# Patient Record
Sex: Female | Born: 1980 | Race: Black or African American | Hispanic: No | Marital: Married | Smoking: Former smoker
Health system: Southern US, Community
[De-identification: ages and names within clinical notes are randomized; demographics above are authoritative.]

## PROBLEM LIST (undated history)

## (undated) DIAGNOSIS — Z789 Other specified health status: Secondary | ICD-10-CM

---
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
## 2002-08-25 ENCOUNTER — Encounter: Payer: Self-pay | Admitting: Emergency Medicine

## 2002-08-25 ENCOUNTER — Emergency Department (HOSPITAL_COMMUNITY): Admission: EM | Admit: 2002-08-25 | Discharge: 2002-08-25 | Payer: Self-pay | Admitting: Emergency Medicine

## 2005-10-18 ENCOUNTER — Emergency Department (HOSPITAL_COMMUNITY): Admission: EM | Admit: 2005-10-18 | Discharge: 2005-10-18 | Payer: Self-pay | Admitting: Emergency Medicine

## 2006-01-27 ENCOUNTER — Emergency Department (HOSPITAL_COMMUNITY): Admission: EM | Admit: 2006-01-27 | Discharge: 2006-01-27 | Payer: Self-pay | Admitting: Emergency Medicine

## 2006-04-08 ENCOUNTER — Emergency Department (HOSPITAL_COMMUNITY): Admission: EM | Admit: 2006-04-08 | Discharge: 2006-04-08 | Payer: Self-pay | Admitting: Emergency Medicine

## 2007-05-08 ENCOUNTER — Ambulatory Visit (HOSPITAL_COMMUNITY): Admission: RE | Admit: 2007-05-08 | Discharge: 2007-05-08 | Payer: Self-pay | Admitting: Obstetrics & Gynecology

## 2009-03-30 ENCOUNTER — Other Ambulatory Visit: Admission: RE | Admit: 2009-03-30 | Discharge: 2009-03-30 | Payer: Self-pay | Admitting: Family Medicine

## 2010-12-11 NOTE — L&D Delivery Note (Signed)
  Annalisa, [facility] [[id number]]  Delivery Note At 6:37 PM a viable female was delivered via Vaginal, Spontaneous Delivery (Presentation: occiput anterior ).  APGAR: 8, 8; weight 1784g.   Placenta status: Intact, Spontaneous.  Cord: 3 vessel with the following complications: none.    Anesthesia:  Epidural Episiotomy: none Lacerations: none Est. Blood Loss (mL): 400  Mom to postpartum.  Baby to NICU.  HARPER,CHARLES A 11/30/2011, 6:59 PM     Jeanna, Giuffre [[id number]]  Delivery Note At 6:44 PM a viable female was delivered via  (Presentation: Right Occiput Anterior with compound hand).  APGAR: 8, 8; weight  1625g.   Placenta status: Intact, Spontaneous.  Cord: 3 vessel with the following complications: none.    Anesthesia: epidural Episiotomy: none Lacerations: none Est. Blood Loss (mL): see above ( ).  Mom to postpartum.  Baby to NICU.  HARPER,CHARLES A 11/30/2011, 6:59 PM

## 2011-06-18 ENCOUNTER — Emergency Department (HOSPITAL_COMMUNITY)
Admission: EM | Admit: 2011-06-18 | Discharge: 2011-06-18 | Disposition: A | Discharge status: Home / Self Care | Payer: Commercial Managed Care - PPO | Attending: Emergency Medicine | Admitting: Emergency Medicine

## 2011-06-18 DIAGNOSIS — O209 Hemorrhage in early pregnancy, unspecified: Secondary | ICD-10-CM | POA: Insufficient documentation

## 2011-06-18 DIAGNOSIS — Z7982 Long term (current) use of aspirin: Secondary | ICD-10-CM | POA: Insufficient documentation

## 2011-06-18 DIAGNOSIS — O30009 Twin pregnancy, unspecified number of placenta and unspecified number of amniotic sacs, unspecified trimester: Secondary | ICD-10-CM | POA: Insufficient documentation

## 2011-06-18 DIAGNOSIS — Z79899 Other long term (current) drug therapy: Secondary | ICD-10-CM | POA: Insufficient documentation

## 2011-06-18 LAB — POCT I-STAT, CHEM 8
BUN: 5 mg/dL — ABNORMAL LOW (ref 6–23)
Calcium, Ion: 1.19 mmol/L (ref 1.12–1.32)
Chloride: 105 meq/L (ref 96–112)
Creatinine, Ser: 0.6 mg/dL (ref 0.50–1.10)
Glucose, Bld: 85 mg/dL (ref 70–99)
HCT: 38 % (ref 36.0–46.0)
Hemoglobin: 12.9 g/dL (ref 12.0–15.0)
Potassium: 4.1 meq/L (ref 3.5–5.1)
Sodium: 136 meq/L (ref 135–145)
TCO2: 21 mmol/L (ref 0–100)

## 2011-06-18 LAB — ABO/RH: ABO/RH(D): A POS

## 2011-06-27 ENCOUNTER — Other Ambulatory Visit: Payer: Self-pay

## 2011-06-28 ENCOUNTER — Other Ambulatory Visit: Payer: Self-pay | Admitting: Obstetrics

## 2011-06-28 DIAGNOSIS — IMO0001 Reserved for inherently not codable concepts without codable children: Secondary | ICD-10-CM

## 2011-06-28 DIAGNOSIS — Z3682 Encounter for antenatal screening for nuchal translucency: Secondary | ICD-10-CM

## 2011-07-14 ENCOUNTER — Other Ambulatory Visit: Payer: Self-pay | Admitting: Obstetrics and Gynecology

## 2011-07-14 ENCOUNTER — Encounter (HOSPITAL_COMMUNITY): Payer: Self-pay

## 2011-07-14 ENCOUNTER — Ambulatory Visit (HOSPITAL_COMMUNITY)
Admission: RE | Admit: 2011-07-14 | Discharge: 2011-07-14 | Disposition: A | Discharge status: Home / Self Care | Payer: Commercial Managed Care - PPO | Source: Ambulatory Visit | Attending: Obstetrics | Admitting: Obstetrics

## 2011-07-14 DIAGNOSIS — Z3682 Encounter for antenatal screening for nuchal translucency: Secondary | ICD-10-CM

## 2011-07-14 DIAGNOSIS — Z3689 Encounter for other specified antenatal screening: Secondary | ICD-10-CM | POA: Insufficient documentation

## 2011-07-14 DIAGNOSIS — O3510X0 Maternal care for (suspected) chromosomal abnormality in fetus, unspecified, not applicable or unspecified: Secondary | ICD-10-CM | POA: Insufficient documentation

## 2011-07-14 DIAGNOSIS — O351XX0 Maternal care for (suspected) chromosomal abnormality in fetus, not applicable or unspecified: Secondary | ICD-10-CM | POA: Insufficient documentation

## 2011-07-14 DIAGNOSIS — O30009 Twin pregnancy, unspecified number of placenta and unspecified number of amniotic sacs, unspecified trimester: Secondary | ICD-10-CM | POA: Insufficient documentation

## 2011-07-14 DIAGNOSIS — IMO0001 Reserved for inherently not codable concepts without codable children: Secondary | ICD-10-CM

## 2011-07-14 NOTE — Progress Notes (Signed)
Report in [facility]-OBGYN/EPIC; follow-up as needed 

## 2011-07-19 ENCOUNTER — Other Ambulatory Visit (HOSPITAL_COMMUNITY): Payer: Commercial Managed Care - PPO

## 2011-08-09 ENCOUNTER — Other Ambulatory Visit: Payer: Self-pay | Admitting: Obstetrics

## 2011-08-09 DIAGNOSIS — IMO0001 Reserved for inherently not codable concepts without codable children: Secondary | ICD-10-CM

## 2011-08-18 ENCOUNTER — Ambulatory Visit (HOSPITAL_COMMUNITY)
Admission: RE | Admit: 2011-08-18 | Discharge: 2011-08-18 | Disposition: A | Discharge status: Home / Self Care | Payer: Commercial Managed Care - PPO | Source: Ambulatory Visit | Attending: Obstetrics | Admitting: Obstetrics

## 2011-08-18 VITALS — BP 119/74 | HR 120 | Wt 237.0 lb

## 2011-08-18 DIAGNOSIS — IMO0001 Reserved for inherently not codable concepts without codable children: Secondary | ICD-10-CM

## 2011-08-18 DIAGNOSIS — O30009 Twin pregnancy, unspecified number of placenta and unspecified number of amniotic sacs, unspecified trimester: Secondary | ICD-10-CM | POA: Insufficient documentation

## 2011-08-18 DIAGNOSIS — O358XX0 Maternal care for other (suspected) fetal abnormality and damage, not applicable or unspecified: Secondary | ICD-10-CM | POA: Insufficient documentation

## 2011-08-18 DIAGNOSIS — Z1389 Encounter for screening for other disorder: Secondary | ICD-10-CM | POA: Insufficient documentation

## 2011-08-18 DIAGNOSIS — Z363 Encounter for antenatal screening for malformations: Secondary | ICD-10-CM | POA: Insufficient documentation

## 2011-08-18 NOTE — Progress Notes (Signed)
Ultrasound in AS/OBGYN/EPIC.  Follow up U/S scheduled 

## 2011-09-01 ENCOUNTER — Encounter (HOSPITAL_COMMUNITY): Payer: Self-pay | Admitting: *Deleted

## 2011-09-01 ENCOUNTER — Inpatient Hospital Stay (HOSPITAL_COMMUNITY)
Admission: AD | Admit: 2011-09-01 | Discharge: 2011-09-01 | Disposition: A | Discharge status: Home / Self Care | Payer: Commercial Managed Care - PPO | Source: Ambulatory Visit | Attending: Obstetrics & Gynecology | Admitting: Obstetrics & Gynecology

## 2011-09-01 DIAGNOSIS — R109 Unspecified abdominal pain: Secondary | ICD-10-CM | POA: Insufficient documentation

## 2011-09-01 DIAGNOSIS — N949 Unspecified condition associated with female genital organs and menstrual cycle: Secondary | ICD-10-CM

## 2011-09-01 DIAGNOSIS — O30002 Twin pregnancy, unspecified number of placenta and unspecified number of amniotic sacs, second trimester: Secondary | ICD-10-CM

## 2011-09-01 HISTORY — DX: Other specified health status: Z78.9

## 2011-09-01 LAB — URINE MICROSCOPIC-ADD ON

## 2011-09-01 LAB — URINALYSIS, ROUTINE W REFLEX MICROSCOPIC
Glucose, UA: NEGATIVE mg/dL
Protein, ur: NEGATIVE mg/dL
pH: 6 (ref 5.0–8.0)

## 2011-09-01 NOTE — ED Provider Notes (Signed)
Chief Complaint  Patient presents with  . Abdominal Pain    S: Kaitlyn Kennedy is a 30 y.o. G1 at [redacted]w[redacted]d weeks presenting with 1 day hx of sharp crampy pain in rt lower abdomen and groin. The pain is exacerbated by walking and changing positions. No dysuria, urgency or frequency. She denies contractions, vaginal bleeding or leakage of fluid. Fetus is active.  ROS: Negative except as noted above.  O: Filed Vitals:   09/01/11 1024  BP: 141/86  Pulse: 97  Temp:   Resp:     Gen: NAD Abd: soft, mildly tender in lower abd and groin. Fundus 26cm Pelvic: NEFG, BUS neg                        Cx: L/C/H FHR 150/160  Results for orders placed during the hospital encounter of 09/01/11 (from the past 24 hour(s))  URINALYSIS, ROUTINE W REFLEX MICROSCOPIC     Status: Abnormal   Collection Time   09/01/11  9:00 AM      Component Value Range   Color, Urine YELLOW  YELLOW    Appearance CLEAR  CLEAR    Specific Gravity, Urine 1.025  1.005 - 1.030    pH 6.0  5.0 - 8.0    Glucose, UA NEGATIVE  NEGATIVE (mg/dL)   Hgb urine dipstick TRACE (*) NEGATIVE    Bilirubin Urine NEGATIVE  NEGATIVE    Ketones, ur NEGATIVE  NEGATIVE (mg/dL)   Protein, ur NEGATIVE  NEGATIVE (mg/dL)   Urobilinogen, UA 0.2  0.0 - 1.0 (mg/dL)   Nitrite NEGATIVE  NEGATIVE    Leukocytes, UA NEGATIVE  NEGATIVE   URINE MICROSCOPIC-ADD ON     Status: Abnormal   Collection Time   09/01/11  9:00 AM      Component Value Range   Squamous Epithelial / LPF MANY (*) RARE    WBC, UA 0-2  <3 (WBC/hpf)   RBC / HPF 3-6  <3 (RBC/hpf)    A: Round Ligament Pain  P:  Reassurance given and general relief measures reviewed: avoidance of precipitating movements, instructions on abdominal tightening/pelvic rock exercises, abdominal binder, rest with hip flexion.

## 2011-09-01 NOTE — Progress Notes (Signed)
Pt states that during the night she woke up with an intense pain when changing positions. Having a slight RLQ pain. No bleeding or leaking.

## 2011-09-14 ENCOUNTER — Other Ambulatory Visit (HOSPITAL_COMMUNITY): Payer: Self-pay | Admitting: Obstetrics and Gynecology

## 2011-09-14 ENCOUNTER — Ambulatory Visit (HOSPITAL_COMMUNITY)
Admission: RE | Admit: 2011-09-14 | Discharge: 2011-09-14 | Disposition: A | Discharge status: Home / Self Care | Payer: Commercial Managed Care - PPO | Source: Ambulatory Visit | Attending: Obstetrics | Admitting: Obstetrics

## 2011-09-14 DIAGNOSIS — IMO0001 Reserved for inherently not codable concepts without codable children: Secondary | ICD-10-CM

## 2011-09-14 DIAGNOSIS — O209 Hemorrhage in early pregnancy, unspecified: Secondary | ICD-10-CM | POA: Insufficient documentation

## 2011-09-14 DIAGNOSIS — O30009 Twin pregnancy, unspecified number of placenta and unspecified number of amniotic sacs, unspecified trimester: Secondary | ICD-10-CM | POA: Insufficient documentation

## 2011-09-15 ENCOUNTER — Ambulatory Visit (HOSPITAL_COMMUNITY): Payer: Commercial Managed Care - PPO

## 2011-09-21 ENCOUNTER — Ambulatory Visit (HOSPITAL_COMMUNITY)
Admission: RE | Admit: 2011-09-21 | Discharge: 2011-09-21 | Disposition: A | Discharge status: Home / Self Care | Payer: Commercial Managed Care - PPO | Source: Ambulatory Visit | Attending: Obstetrics | Admitting: Obstetrics

## 2011-09-21 DIAGNOSIS — O26879 Cervical shortening, unspecified trimester: Secondary | ICD-10-CM | POA: Insufficient documentation

## 2011-09-21 DIAGNOSIS — O4690 Antepartum hemorrhage, unspecified, unspecified trimester: Secondary | ICD-10-CM | POA: Insufficient documentation

## 2011-09-21 DIAGNOSIS — IMO0001 Reserved for inherently not codable concepts without codable children: Secondary | ICD-10-CM

## 2011-09-21 DIAGNOSIS — O30009 Twin pregnancy, unspecified number of placenta and unspecified number of amniotic sacs, unspecified trimester: Secondary | ICD-10-CM | POA: Insufficient documentation

## 2011-09-28 ENCOUNTER — Ambulatory Visit (HOSPITAL_COMMUNITY)
Admission: RE | Admit: 2011-09-28 | Discharge: 2011-09-28 | Disposition: A | Discharge status: Home / Self Care | Payer: Commercial Managed Care - PPO | Source: Ambulatory Visit | Attending: Obstetrics & Gynecology | Admitting: Obstetrics & Gynecology

## 2011-09-28 DIAGNOSIS — IMO0001 Reserved for inherently not codable concepts without codable children: Secondary | ICD-10-CM

## 2011-09-28 DIAGNOSIS — O26879 Cervical shortening, unspecified trimester: Secondary | ICD-10-CM | POA: Insufficient documentation

## 2011-09-28 DIAGNOSIS — O30009 Twin pregnancy, unspecified number of placenta and unspecified number of amniotic sacs, unspecified trimester: Secondary | ICD-10-CM | POA: Insufficient documentation

## 2011-10-05 ENCOUNTER — Other Ambulatory Visit: Payer: Self-pay | Admitting: Obstetrics & Gynecology

## 2011-10-05 ENCOUNTER — Ambulatory Visit (HOSPITAL_COMMUNITY)
Admission: RE | Admit: 2011-10-05 | Discharge: 2011-10-05 | Disposition: A | Discharge status: Home / Self Care | Payer: Commercial Managed Care - PPO | Source: Ambulatory Visit | Attending: Obstetrics & Gynecology | Admitting: Obstetrics & Gynecology

## 2011-10-05 DIAGNOSIS — O30009 Twin pregnancy, unspecified number of placenta and unspecified number of amniotic sacs, unspecified trimester: Secondary | ICD-10-CM | POA: Insufficient documentation

## 2011-10-05 DIAGNOSIS — Z09 Encounter for follow-up examination after completed treatment for conditions other than malignant neoplasm: Secondary | ICD-10-CM

## 2011-10-05 DIAGNOSIS — O26879 Cervical shortening, unspecified trimester: Secondary | ICD-10-CM | POA: Insufficient documentation

## 2011-10-05 DIAGNOSIS — IMO0001 Reserved for inherently not codable concepts without codable children: Secondary | ICD-10-CM

## 2011-10-06 ENCOUNTER — Other Ambulatory Visit: Payer: Self-pay | Admitting: Obstetrics & Gynecology

## 2011-10-12 ENCOUNTER — Inpatient Hospital Stay (HOSPITAL_COMMUNITY)
Admission: AD | Admit: 2011-10-12 | Discharge: 2011-10-12 | Disposition: A | Discharge status: Home / Self Care | Payer: Commercial Managed Care - PPO | Source: Ambulatory Visit | Attending: Obstetrics & Gynecology | Admitting: Obstetrics & Gynecology

## 2011-10-12 ENCOUNTER — Ambulatory Visit (HOSPITAL_COMMUNITY)
Admission: RE | Admit: 2011-10-12 | Discharge: 2011-10-12 | Disposition: A | Discharge status: Home / Self Care | Payer: Commercial Managed Care - PPO | Source: Ambulatory Visit | Attending: Obstetrics & Gynecology | Admitting: Obstetrics & Gynecology

## 2011-10-12 DIAGNOSIS — O4700 False labor before 37 completed weeks of gestation, unspecified trimester: Secondary | ICD-10-CM | POA: Insufficient documentation

## 2011-10-12 DIAGNOSIS — O26879 Cervical shortening, unspecified trimester: Secondary | ICD-10-CM | POA: Insufficient documentation

## 2011-10-12 DIAGNOSIS — IMO0001 Reserved for inherently not codable concepts without codable children: Secondary | ICD-10-CM

## 2011-10-12 DIAGNOSIS — O30009 Twin pregnancy, unspecified number of placenta and unspecified number of amniotic sacs, unspecified trimester: Secondary | ICD-10-CM | POA: Insufficient documentation

## 2011-10-12 DIAGNOSIS — Z09 Encounter for follow-up examination after completed treatment for conditions other than malignant neoplasm: Secondary | ICD-10-CM

## 2011-10-12 MED ORDER — BETAMETHASONE SOD PHOS & ACET 6 (3-3) MG/ML IJ SUSP
12.0000 mg | Freq: Once | INTRAMUSCULAR | Status: AC
  Administered 2011-10-12: 12 mg via INTRAMUSCULAR
  Filled 2011-10-12: qty 2

## 2011-10-12 NOTE — Plan of Care (Signed)
Patient presented to MAU for Betamethasone. No order is available in chart. [facility]aternal [facility]etal [facility] notified of this patient who has an appointment with them at 1300. Will call Dr. Tamela Oddi for order. Patient will keep her appointment and return to MAU after.

## 2011-10-12 NOTE — Progress Notes (Signed)
Ultrasound in AS/OBGYN/EPIC.  Follow up U/S scheduled 

## 2011-10-13 ENCOUNTER — Inpatient Hospital Stay (HOSPITAL_COMMUNITY)
Admission: AD | Admit: 2011-10-13 | Discharge: 2011-10-13 | Disposition: A | Discharge status: Home / Self Care | Payer: Commercial Managed Care - PPO | Source: Ambulatory Visit | Attending: Obstetrics & Gynecology | Admitting: Obstetrics & Gynecology

## 2011-10-13 DIAGNOSIS — O4700 False labor before 37 completed weeks of gestation, unspecified trimester: Secondary | ICD-10-CM | POA: Insufficient documentation

## 2011-10-13 MED ORDER — BETAMETHASONE SOD PHOS & ACET 6 (3-3) MG/ML IJ SUSP
12.0000 mg | Freq: Once | INTRAMUSCULAR | Status: AC
  Administered 2011-10-13: 12 mg via INTRAMUSCULAR
  Filled 2011-10-13: qty 2

## 2011-10-13 NOTE — ED Notes (Signed)
No problems with injection yesterday- tolerated well.

## 2011-10-13 NOTE — ED Notes (Signed)
Tolerated injection.

## 2011-10-19 ENCOUNTER — Other Ambulatory Visit (HOSPITAL_COMMUNITY): Payer: Self-pay | Admitting: Obstetrics and Gynecology

## 2011-10-19 DIAGNOSIS — IMO0001 Reserved for inherently not codable concepts without codable children: Secondary | ICD-10-CM

## 2011-10-26 ENCOUNTER — Ambulatory Visit (HOSPITAL_COMMUNITY)
Admit: 2011-10-26 | Discharge: 2011-10-26 | Disposition: A | Discharge status: Home / Self Care | Payer: Commercial Managed Care - PPO | Attending: Obstetrics & Gynecology | Admitting: Obstetrics & Gynecology

## 2011-10-26 DIAGNOSIS — O26879 Cervical shortening, unspecified trimester: Secondary | ICD-10-CM | POA: Insufficient documentation

## 2011-10-26 DIAGNOSIS — IMO0001 Reserved for inherently not codable concepts without codable children: Secondary | ICD-10-CM

## 2011-10-26 DIAGNOSIS — O30009 Twin pregnancy, unspecified number of placenta and unspecified number of amniotic sacs, unspecified trimester: Secondary | ICD-10-CM | POA: Insufficient documentation

## 2011-11-09 ENCOUNTER — Ambulatory Visit (HOSPITAL_COMMUNITY): Payer: Managed Care, Other (non HMO)

## 2011-11-09 ENCOUNTER — Ambulatory Visit (HOSPITAL_COMMUNITY)
Admit: 2011-11-09 | Discharge: 2011-11-09 | Disposition: A | Discharge status: Home / Self Care | Payer: Commercial Managed Care - PPO | Attending: Obstetrics & Gynecology | Admitting: Obstetrics & Gynecology

## 2011-11-09 DIAGNOSIS — O26879 Cervical shortening, unspecified trimester: Secondary | ICD-10-CM | POA: Insufficient documentation

## 2011-11-09 DIAGNOSIS — O30009 Twin pregnancy, unspecified number of placenta and unspecified number of amniotic sacs, unspecified trimester: Secondary | ICD-10-CM | POA: Insufficient documentation

## 2011-11-09 DIAGNOSIS — IMO0001 Reserved for inherently not codable concepts without codable children: Secondary | ICD-10-CM

## 2011-11-16 ENCOUNTER — Encounter: Payer: Self-pay | Admitting: Obstetrics & Gynecology

## 2011-11-16 ENCOUNTER — Inpatient Hospital Stay (HOSPITAL_COMMUNITY)
Admission: RE | Admit: 2011-11-16 | Discharge: 2011-12-02 | DRG: 775 | Disposition: A | Discharge status: Home / Self Care | Payer: Commercial Managed Care - PPO | Source: Ambulatory Visit | Attending: Neonatology | Admitting: Neonatology

## 2011-11-16 DIAGNOSIS — O4290 Premature rupture of membranes, unspecified as to length of time between rupture and onset of labor, unspecified weeks of gestation: Principal | ICD-10-CM | POA: Diagnosis present

## 2011-11-16 DIAGNOSIS — O328XX0 Maternal care for other malpresentation of fetus, not applicable or unspecified: Secondary | ICD-10-CM | POA: Diagnosis present

## 2011-11-16 DIAGNOSIS — O42913 Preterm premature rupture of membranes, unspecified as to length of time between rupture and onset of labor, third trimester: Secondary | ICD-10-CM

## 2011-11-16 DIAGNOSIS — O4700 False labor before 37 completed weeks of gestation, unspecified trimester: Secondary | ICD-10-CM | POA: Diagnosis present

## 2011-11-16 DIAGNOSIS — O30009 Twin pregnancy, unspecified number of placenta and unspecified number of amniotic sacs, unspecified trimester: Secondary | ICD-10-CM | POA: Diagnosis present

## 2011-11-16 LAB — WET PREP, GENITAL

## 2011-11-16 MED ORDER — PROGESTERONE MICRONIZED 200 MG PO CAPS
200.0000 mg | ORAL_CAPSULE | Freq: Every day | ORAL | Status: DC
  Filled 2011-11-16: qty 1

## 2011-11-16 MED ORDER — PROGESTERONE MICRONIZED 200 MG PO CAPS
200.0000 mg | ORAL_CAPSULE | Freq: Every day | ORAL | Status: DC
  Administered 2011-11-17 – 2011-11-29 (×13): 200 mg via VAGINAL
  Filled 2011-11-16 (×13): qty 1

## 2011-11-16 MED ORDER — PROGESTERONE MICRONIZED 200 MG PO CAPS: 200.0000 mg | ORAL_CAPSULE | Freq: Every day | ORAL | Status: DC

## 2011-11-16 MED ORDER — ZOLPIDEM TARTRATE 10 MG PO TABS
10.0000 mg | ORAL_TABLET | Freq: Every evening | ORAL | Status: DC | PRN
  Administered 2011-11-18 – 2011-11-29 (×10): 10 mg via ORAL
  Filled 2011-11-16 (×12): qty 1

## 2011-11-16 MED ORDER — LACTATED RINGERS IV SOLN
INTRAVENOUS | Status: DC
  Administered 2011-11-16: 75 mL/h via INTRAVENOUS
  Administered 2011-11-17 – 2011-11-19 (×5): via INTRAVENOUS

## 2011-11-16 MED ORDER — MAGNESIUM SULFATE 40 G IN LACTATED RINGERS - SIMPLE
2.0000 g/h | INTRAVENOUS | Status: DC
  Administered 2011-11-16 – 2011-11-18 (×3): 2 g/h via INTRAVENOUS
  Filled 2011-11-16 (×3): qty 500

## 2011-11-16 MED ORDER — MAGNESIUM SULFATE 40 G IN LACTATED RINGERS - SIMPLE: 2.0000 g/h | INTRAVENOUS | Status: DC

## 2011-11-16 MED ORDER — ACETAMINOPHEN 325 MG PO TABS
650.0000 mg | ORAL_TABLET | ORAL | Status: DC | PRN
  Administered 2011-11-17 – 2011-11-21 (×7): 650 mg via ORAL
  Filled 2011-11-16 (×7): qty 2

## 2011-11-16 MED ORDER — PRENATAL PLUS 27-1 MG PO TABS
1.0000 | ORAL_TABLET | Freq: Every day | ORAL | Status: DC
  Administered 2011-11-17 – 2011-11-30 (×14): 1 via ORAL
  Filled 2011-11-16 (×14): qty 1

## 2011-11-16 MED ORDER — DOCUSATE SODIUM 100 MG PO CAPS
100.0000 mg | ORAL_CAPSULE | Freq: Every day | ORAL | Status: DC
  Administered 2011-11-17 – 2011-11-30 (×14): 100 mg via ORAL
  Filled 2011-11-16 (×14): qty 1

## 2011-11-16 MED ORDER — MAGNESIUM SULFATE BOLUS VIA INFUSION
4.0000 g | Freq: Once | INTRAVENOUS | Status: AC
  Administered 2011-11-16: 4 g via INTRAVENOUS
  Filled 2011-11-16: qty 500

## 2011-11-16 MED ORDER — METRONIDAZOLE 500 MG PO TABS
500.0000 mg | ORAL_TABLET | Freq: Two times a day (BID) | ORAL | Status: AC
  Administered 2011-11-16 – 2011-11-23 (×14): 500 mg via ORAL
  Filled 2011-11-16 (×14): qty 1

## 2011-11-16 MED ORDER — CALCIUM CARBONATE ANTACID 500 MG PO CHEW
2.0000 | CHEWABLE_TABLET | ORAL | Status: DC | PRN
  Administered 2011-11-17 – 2011-11-29 (×24): 400 mg via ORAL
  Filled 2011-11-16 (×17): qty 2
  Filled 2011-11-16: qty 1
  Filled 2011-11-16 (×6): qty 2

## 2011-11-16 NOTE — Progress Notes (Addendum)
Kaitlyn Kennedy is a 30 y.o. G2P0010 at [redacted]w[redacted]d by LMP (date of conception, IVF) admitted for Preterm labor.  Subjective: Pt not feeling contractions.   Objective: BP 97/78  Pulse 111  Temp 98.7 F (37.1 C)  Resp 20   Total I/O In: 62 [I.V.:62] Out: 50 [Urine:50]  FHT:  FHR: A: 140, B: 145 bpm, variability: moderate,  accelerations:  Present,  decelerations:  Absent UC:   irregular, UI with occasional contraction. SVE:     2-3cm/100%/-1 Labs: Lab Results  Component Value Date   HGB 12.9 06/18/2011   HCT 38.0 06/18/2011    Assessment / Plan: Preterm labor, on Magnesium Sulfate at 2grams/hr. Cultures and wet prep obtained. Pt on strict bedrest with bedside commode/bedpan, pelvic rest. Consult with MD, magnesium level 6 hours post bolus. Neonatalogist consult. Moderate clue cells noted on wet prep, Rx Flagyl 500mg  PO BID x 7 days  Preeclampsia:  on magnesium sulfate and no signs or symptoms of toxicity Fetal Wellbeing:  Category I I/D:  n/a  Anice Paganini CNM 11/16/2011, 9:55 PM

## 2011-11-16 NOTE — H&P (Signed)
Kaitlyn Kennedy is a 30 y.o. female G2P0 at 80w 2d gestation with twins presenting for contractions. Maternal Medical History:  Reason for admission: Reason for admission: contractions.  Reason for Admission:   nauseaContractions: Onset was more than 2 days ago.    Fetal activity: Perceived fetal activity is normal.   Last perceived fetal movement was within the past hour.    Prenatal complications: Twin gestation  Prenatal Complications - Diabetes: none.    OB History    Grav Para Term Preterm Abortions TAB SAB Ect Mult Living   1 0 0 0 0 0 0 0 0 0      Past Medical History  Diagnosis Date  . No pertinent past medical history    Past Surgical History  Procedure Date  . Inguinal hernia repair 1985   Family History: family history is not on file. Social History:  reports that she has quit smoking. She does not have any smokeless tobacco history on file. She reports that she does not drink alcohol or use illicit drugs.  Review of Systems  Constitutional: Negative for fever.  Eyes: Negative for blurred vision.  Respiratory: Negative for cough and shortness of breath.   Cardiovascular: Negative for chest pain and palpitations.  Gastrointestinal: Negative for nausea and vomiting.  Genitourinary: Negative.   Musculoskeletal: Negative for back pain.  Skin: Negative.   Neurological: Negative for dizziness and headaches.      There were no vitals taken for this visit. Maternal Exam:  Uterine Assessment: Contraction strength is mild.  Contraction frequency is regular.   Abdomen: Patient reports no abdominal tenderness. Introitus: Normal vulva. Normal vagina.  Pelvis: adequate for delivery.   Cervix: Cervix evaluated by digital exam.   Cervix 1cm dilated, checked in office today. Last cervical length 1cm on TVUS 10/26/11.   Fetal Exam Fetal Monitor Review: Baseline rate: A: 135, B: 140.      Physical Exam  Constitutional: She is oriented to person, place, and time. She  appears well-developed and well-nourished. No distress.  HENT:  Head: Normocephalic and atraumatic.  Eyes: Pupils are equal, round, and reactive to light.  Neck: Normal range of motion.  Cardiovascular: Normal rate, regular rhythm and normal heart sounds.   Respiratory: Effort normal and breath sounds normal.  GI: Soft. Bowel sounds are normal.  Genitourinary: Vagina normal and uterus normal.  Musculoskeletal: Normal range of motion.  Neurological: She is alert and oriented to person, place, and time.  Skin: Skin is warm and dry.  Psychiatric: She has a normal mood and affect. Her behavior is normal. Thought content normal.    Prenatal labs: ABO, Rh: --/--/A POS (07/08 1610) Antibody:   Rubella:   RPR:    HBsAg:    HIV:    GBS:     Assessment/Plan: Admit to Antepartum. Continuous fetal monitoring and tocometry, bedrest, pelvic rest. Magnesium Sulfate 4gm load, then 2gm/hr for 48 hrs. IV hydration.    Anice Paganini CNM 11/16/2011, 7:15 PM

## 2011-11-17 LAB — GC/CHLAMYDIA PROBE AMP, GENITAL: Chlamydia, DNA Probe: NEGATIVE

## 2011-11-17 LAB — URINE CULTURE: Culture  Setup Time: 201212070144

## 2011-11-17 LAB — ANTIBODY SCREEN: Antibody Screen: NEGATIVE

## 2011-11-17 LAB — ABO/RH: RH Type: POSITIVE

## 2011-11-17 LAB — MAGNESIUM: Magnesium: 3.3 mg/dL — ABNORMAL HIGH (ref 1.5–2.5)

## 2011-11-17 LAB — RPR: RPR: NONREACTIVE

## 2011-11-17 MED ORDER — ONDANSETRON 4 MG PO TBDP
4.0000 mg | ORAL_TABLET | Freq: Three times a day (TID) | ORAL | Status: DC | PRN
  Administered 2011-11-17: 4 mg via ORAL
  Filled 2011-11-17: qty 1

## 2011-11-17 MED ORDER — FAMOTIDINE 20 MG PO TABS
20.0000 mg | ORAL_TABLET | Freq: Two times a day (BID) | ORAL | Status: DC
  Administered 2011-11-17 – 2011-11-30 (×27): 20 mg via ORAL
  Filled 2011-11-17 (×26): qty 1

## 2011-11-17 NOTE — Progress Notes (Signed)
UR Chart review completed.  

## 2011-11-17 NOTE — Consult Note (Signed)
Asked by R. Hamby, CNM, to speak to [name] regarding outcome of 30 wk twins as she was admitted in preterm labor.    Chart reviewed. Prenatal labs are neg. She is currently on magnesium sulfate, Flagyl, and progesterone. She has received betamethasone in Nov. Based on  [place] on 11/30  Baby A is 1370 gms (52%), B is 1299 (43%).   I spoke to [name] in the presence of her parents. I mentioned high survival rate at [redacted] wks gestation. Discussed resuscitation and usual complications of prematurity such as RDS, various respiratory support, IVH,  nutritional support with HAL and gavage feeding, temp support, and LOS. Discussed benefits of breastfeeding. Discussed different levels of care anticipated depending on age of gestation when the babies are born.  [name] was overwhelmed with the information upon hearing LOS. Discussed difference in LOS with varying gestation with and without complications. I encouraged her to be positive and remain hopeful in being able to deliver at a later gestation.  Thank you for this consult.  Face to face time: 25 min.  Kaitlyn Kennedy Q

## 2011-11-17 NOTE — Progress Notes (Signed)
S: Preterm labor symptoms: none  O: Blood pressure 110/43, pulse 115, temperature 98.5 F (36.9 C), temperature source Oral, resp. rate 24, height 5\' 8"  (1.727 m), weight 117.028 kg (258 lb).  Total I/O In: 1420 [P.O.:720; I.V.:700] Out: 900 [Urine:900]  12/06 0701 - 12/07 0700 In: 1902 [P.O.:840; I.V.:1062] Out: 810 [Urine:810]     FHT: FHT x 2 reassuring Toco: None ZOX:WRUEAVWU: 2.5 Effacement (%): 100 Station: -1 Presentation: Vertex Exam by:: R Hamby, CNm  A/P- 30 y.o. admitted with preterm labor  and twin gestation Preterm labor management: bedrest advised, pelvic rest advised and MgSO4 Dating:  [redacted]w[redacted]d

## 2011-11-18 MED ORDER — MAGNESIUM SULFATE 40 G IN LACTATED RINGERS - SIMPLE
3.0000 g/h | INTRAVENOUS | Status: DC
  Administered 2011-11-19: 2 g/h via INTRAVENOUS
  Administered 2011-11-19: 3 g/h via INTRAVENOUS
  Filled 2011-11-18: qty 500

## 2011-11-18 MED ORDER — MAGNESIUM SULFATE 40 G IN LACTATED RINGERS - SIMPLE
2.0000 g/h | INTRAVENOUS | Status: AC
  Filled 2011-11-18: qty 500

## 2011-11-18 MED ORDER — BUTORPHANOL TARTRATE 2 MG/ML IJ SOLN: 1.0000 mg | Freq: Once | INTRAMUSCULAR | Status: DC

## 2011-11-18 NOTE — Progress Notes (Signed)
Patient ID: Kaitlyn Kennedy, female   DOB: 08-Feb-1981, 30 y.o.   MRN: [id number] Vital signs normal No contractions Doing well

## 2011-11-18 NOTE — Progress Notes (Signed)
RN to the bedside - encouraged pt. To order breakfast - fetal monitoring after pt. Eats breakfast.  Pt.  States, "OK".

## 2011-11-19 LAB — CULTURE, BETA STREP (GROUP B ONLY)

## 2011-11-19 LAB — MAGNESIUM: Magnesium: 3.4 mg/dL — ABNORMAL HIGH (ref 1.5–2.5)

## 2011-11-19 NOTE — Progress Notes (Signed)
Pt. Off monitor for BM, bed bath, and wash hair via bed.  Toco reapplied

## 2011-11-19 NOTE — Progress Notes (Signed)
Patient ID: Kaitlyn Kennedy, female   DOB: [birth year], 30 y.o.   MRN: [id number] Vital signs normal Yesterday magnesium was increased to 3 g for 3 hours because she was contracting Q. Stopped contracting and she's back and 2 g per hour no complaints

## 2011-11-20 MED ORDER — SODIUM CHLORIDE 0.9 % IJ SOLN
3.0000 mL | Freq: Two times a day (BID) | INTRAMUSCULAR | Status: DC
  Administered 2011-11-20 – 2011-11-21 (×2): 3 mL via INTRAVENOUS

## 2011-11-20 MED ORDER — DIPHENHYDRAMINE HCL 25 MG PO CAPS
25.0000 mg | ORAL_CAPSULE | Freq: Once | ORAL | Status: AC
  Administered 2011-11-20: 25 mg via ORAL
  Filled 2011-11-20: qty 1

## 2011-11-20 MED ORDER — NIFEDIPINE ER 60 MG PO TB24
60.0000 mg | ORAL_TABLET | Freq: Every day | ORAL | Status: DC
  Administered 2011-11-20 – 2011-11-29 (×10): 60 mg via ORAL
  Filled 2011-11-20 (×11): qty 1

## 2011-11-20 NOTE — Progress Notes (Signed)
Patient ID: Chance Karam, female   DOB: 1981/07/25, 30 y.o.   MRN: [id number] S: Preterm labor symptoms: none this morning. Pt would like to shower or wheelchair ride if she is allowed.   O: Blood pressure 124/57, pulse 105, temperature 97.9 F (36.6 C), temperature source Oral, resp. rate 30, height 5\' 8"  (1.727 m), weight 117.028 kg (258 lb), SpO2 97.00%.   WUJ:WJXBJYNW: A: 130, B 140 bpm, Variability: Good {> 6 bpm), Accelerations: Reactive and Decelerations: Absent Toco: UI only last few hours.  GNF:AOZHYQMV: 2.5 Effacement (%): 100 Cervical Position: Anterior Station: -2 Presentation: Vertex Exam by:: R Hamby CNM  A/P- 30 y.o. admitted with preterm labor  and twin gestation. Preterm labor management: bedrest advised, pelvic rest advised and Will DC Magnesium today, if resumes contractions, will Rx Procardia.  Re-examined cervix per MD order, no change since last week on admission.  Dating:  [redacted]w[redacted]d PNL Needed:  none PTL:  stable  Anice Paganini CNM 9:55 AM 11/20/11

## 2011-11-20 NOTE — Progress Notes (Signed)
UR chart review completed.  

## 2011-11-21 MED ORDER — DIPHENHYDRAMINE HCL 25 MG PO CAPS
25.0000 mg | ORAL_CAPSULE | Freq: Once | ORAL | Status: AC
  Administered 2011-11-21: 25 mg via ORAL
  Filled 2011-11-21: qty 1

## 2011-11-21 MED ORDER — CYCLOBENZAPRINE HCL 10 MG PO TABS
10.0000 mg | ORAL_TABLET | Freq: Three times a day (TID) | ORAL | Status: DC | PRN
  Administered 2011-11-23 – 2011-11-29 (×7): 10 mg via ORAL
  Filled 2011-11-21 (×8): qty 1

## 2011-11-21 MED ORDER — TRAMADOL HCL 50 MG PO TABS
50.0000 mg | ORAL_TABLET | Freq: Four times a day (QID) | ORAL | Status: DC
  Administered 2011-11-21 – 2011-11-30 (×36): 50 mg via ORAL
  Filled 2011-11-21 (×36): qty 1

## 2011-11-21 NOTE — Progress Notes (Signed)
Patient ID: Kaitlyn Kennedy, female   DOB: Feb 22, 1981, 30 y.o.   MRN: [id number] S: Preterm labor symptoms: low back pain and pelvic pressure  O: Blood pressure 135/77, pulse 101, temperature 98.4 F (36.9 C), temperature source Oral, resp. rate 24, height 5\' 8"  (1.727 m), weight 117.028 kg (258 lb), SpO2 97.00%.   ZDG:UYQIHKVQ: 150 bpm Toco: occ. UC QVZ:DGLOVFIE: 2.5 Effacement (%): 100 Cervical Position: Anterior Station: -2 Presentation: Vertex Exam by:: R Hamby CNM  A/P- 30 y.o. admitted with preterm labor  Preterm labor management: IV D5LR started, Procardia XL 60mg  po daily. Dating:  [redacted]w[redacted]d PNL Needed:  none FWB:  good PTL:  stable

## 2011-11-22 MED ORDER — TERBUTALINE SULFATE 1 MG/ML IJ SOLN
0.2500 mg | Freq: Once | INTRAMUSCULAR | Status: AC
  Administered 2011-11-22: 0.25 mg via SUBCUTANEOUS
  Filled 2011-11-22: qty 1

## 2011-11-22 NOTE — Progress Notes (Signed)
When asked if patient is feeling any contractions, pt. States, "No, I'm not feeling any contractions. Pt. Voided an hour ago; RN encouraged pt. To drink plenty of fluids (water) and keep her bladder emptied.

## 2011-11-22 NOTE — Progress Notes (Signed)
Pt. Continues to drink plenty of fluids (water) 1 pitcher in last hour.

## 2011-11-22 NOTE — Progress Notes (Signed)
Monitor was in hold mode - monitor moved to active mode. No contractions - per pt.

## 2011-11-22 NOTE — Progress Notes (Signed)
RN to the bedside to place pt. On EFM monitoring.  Pt. Desires to wait - wants to sleep.  Will call out when ready to be monitored.

## 2011-11-22 NOTE — Progress Notes (Signed)
Notified Dr. Clearance Coots of uterine contractions, pt. States she is not feeling contractions.  Per Dr. Clearance Coots, "call me if she starts to feel ctx - hurting".

## 2011-11-22 NOTE — Progress Notes (Signed)
UR chart review completed.  

## 2011-11-22 NOTE — Progress Notes (Signed)
Patient ID: Kaitlyn Kennedy, female   DOB: Sep 24, 1981, 30 y.o.   MRN: [id number] S: Preterm labor symptoms: occasional runs of mild UC's.  O: Blood pressure 123/83, pulse 115, temperature 98.7 F (37.1 C), temperature source Oral, resp. rate 18, height 5\' 8"  (1.727 m), weight 117.028 kg (258 lb), SpO2 97.00%.   WUJ:WJXBJYNW: 150 bpm Toco: occasional GNF:AOZHYQMV: 2.5 Effacement (%): 100 Cervical Position: Anterior Station: -2 Presentation: Vertex Exam by:: R Hamby CNM  A/P- 30 y.o. admitted with preterm labor  Preterm labor management: bedrest advised Dating:  [redacted]w[redacted]d PNL Needed:  none FWB:  good PTL:  none

## 2011-11-23 NOTE — Progress Notes (Signed)
Patient ID: Chaitra Mast, female   DOB: [birth year], 30 y.o.   MRN: [id number] S: Preterm labor symptoms: occasional UC's.  O: Blood pressure 137/87, pulse 100, temperature 98.3 F (36.8 C), temperature source Oral, resp. rate 20, height 5\' 8"  (1.727 m), weight 121.337 kg (267 lb 8 oz), SpO2 97.00%.   OZH:YQMVHQIO: 140 bpm Toco: occ. NGE:XBMWUXLK: 2.5 Effacement (%): 100 Cervical Position: Anterior Station: -2 Presentation: Vertex Exam by:: R Hamby CNM  A/P- 30 y.o. admitted with preterm labor  Preterm labor management: bedrest advised Dating:  [redacted]w[redacted]d PNL Needed:  none FWB:  good PTL:  stable

## 2011-11-23 NOTE — Progress Notes (Signed)
UR Chart review completed.  

## 2011-11-23 NOTE — Progress Notes (Signed)
[redacted] weeks gestation, with PTL, twins.  Height  68"  Weight 267 Lbs pre-pregnancy weight 221 Lbs .Pre-pregnancy  BMI 33.6 (obese)  IBW 140 Lbs  Total weight gain 46 Lbs. Weight gain goals 24-42 Lbs.   Estimated needs: 25-2600 kcal/day, 95-105 grams protein/day, 2.5 liters fluid/day regular diet tolerated well, appetite good. Pt does not drink milk, some degree of lactose intolerance. Tums providing calcium Current diet prescription will provide for increased needs. No abnormal nutrition related labs  Nutrition Dx: Increased nutrient needs r/t pregnancy and fetal growth requirements aeb [redacted] weeks gestation.  No educational needs assessed at this time.

## 2011-11-24 NOTE — Progress Notes (Signed)
UR Chart review completed.  

## 2011-11-24 NOTE — Progress Notes (Signed)
Patient ID: Kaitlyn Kennedy, female   DOB: 1981-06-28, 30 y.o.   MRN: [id number] S: Preterm labor symptoms: preterm cervical changes and UC's.  O: Blood pressure 122/70, pulse 120, temperature 97.9 F (36.6 C), temperature source Oral, resp. rate 20, height 5\' 8"  (1.727 m), weight 121.337 kg (267 lb 8 oz), SpO2 97.00%.   VHQ:IONGEXBM: 140's bpm Toco: occ. WUX:LKGMWNUU: 2.5 Effacement (%): 100 Cervical Position: Anterior Station: -2 Presentation: Vertex Exam by:: R Hamby CNM  A/P- 30 y.o. admitted with preterm labor  Preterm labor management: bedrest advised Dating:  [redacted]w[redacted]d PNL Needed:  none FWB:  good PTL:  stable

## 2011-11-25 NOTE — Progress Notes (Signed)
Patient ID: Kaitlyn Kennedy, female   DOB: October 16, 1981, 30 y.o.   MRN: [id number] S: Preterm labor symptoms: none  O: Blood pressure 111/42, pulse 110, temperature 98.4 F (36.9 C), temperature source Oral, resp. rate 18, height 5\' 8"  (1.727 m), weight 121.337 kg (267 lb 8 oz), SpO2 97.00%.   FAO:ZHYQMVHQ: 150 bpm x 2 Toco: None ION:GEXBMWUX: 2.5 Effacement (%): 100 Cervical Position: Anterior Station: -2 Presentation: Vertex Exam by:: R Hamby CNM  A/P- 30 y.o. admitted with preterm labor  and twin gestation Preterm labor management: bedrest advised and pelvic rest advised Dating:  [redacted]w[redacted]d   PTL:  Continue Procardia

## 2011-11-26 ENCOUNTER — Inpatient Hospital Stay (HOSPITAL_COMMUNITY): Payer: Commercial Managed Care - PPO

## 2011-11-26 NOTE — Progress Notes (Signed)
Pt asleep.

## 2011-11-26 NOTE — Progress Notes (Signed)
Patient ID: Kaitlyn Kennedy, female   DOB: Nov 19, 1981, 30 y.o.   MRN: [id number] S: Preterm labor symptoms: pelvic pressure  O: Blood pressure 102/47, pulse 101, temperature 98.4 F (36.9 C), temperature source Oral, resp. rate 18, height 5\' 8"  (1.727 m), weight 121.337 kg (267 lb 8 oz), SpO2 99.00%.   WUJ:WJXBJYNWGN x 2 Toco: None FAO:ZHYQMVHQ: 2.5 Effacement (%): 100 Cervical Position: Anterior Station: -2 Presentation: Vertex Exam by:: R Hamby CNM  A/P- 30 y.o. admitted with preterm labor  and twin gestation Preterm labor management: bedrest advised, pelvic rest advised and Procardia, maintenance Dating:  [redacted]w[redacted]d  PTL:  See above

## 2011-11-27 NOTE — Progress Notes (Signed)
Patient ID: Safina Huard, female   DOB: 1981-01-10, 30 y.o.   MRN: [id number] S: Preterm labor symptoms: UC's  O: Blood pressure 140/64, pulse 106, temperature 98.7 F (37.1 C), temperature source Oral, resp. rate 20, height 5\' 8"  (1.727 m), weight 121.337 kg (267 lb 8 oz), SpO2 99.00%.   WUJ:WJXBJYNW: 140 bpm Toco: occasional UC's GNF:AOZHYQMV: 2.5 Effacement (%): 100 Cervical Position: Anterior Station: -2 Presentation: Vertex Exam by:: R Hamby CNM  A/P- 29 y.o. admitted with preterm labor  Preterm labor management: bedrest advised Dating:  [redacted]w[redacted]d PNL Needed:  none FWB:  good PTL:  stable

## 2011-11-28 ENCOUNTER — Inpatient Hospital Stay (HOSPITAL_COMMUNITY): Payer: Commercial Managed Care - PPO

## 2011-11-28 LAB — STREP B DNA PROBE: GBS: NEGATIVE

## 2011-11-28 NOTE — Progress Notes (Signed)
S: Preterm labor symptoms: reports no symptoms, pressure, or contractions.   O: Blood pressure 107/64, pulse 143, temperature 98.8 F (37.1 C), temperature source Oral, resp. rate 30, height 5\' 8"  (1.727 m), weight 121.337 kg (267 lb 8 oz), SpO2 99.00%.   RUE:AVWUJWJX: 150 A and 150 B bpm Toco: UI with occasional contraction BJY:NWGNFAOZ: 2.5 Effacement (%): 100 Cervical Position: Anterior Station: -2 Presentation: Vertex Exam by:: R Hamby CNM  A/P- 30 y.o. admitted with preterm labor  Preterm labor management: Continue bedrest, Procardia. Pt desires DC home, will consult with MFM tomorrow regarding plan of care.  Dating:  [redacted]w[redacted]d  HAMBY, Milea Klink 11/28/2011 7:28 PM

## 2011-11-28 NOTE — Progress Notes (Signed)
Patient ID: Benita Boonstra, female   DOB: August 19, 1981, 30 y.o.   MRN: [id number] S: Preterm labor symptoms: occasional contraction.  O: Blood pressure 126/68, pulse 121, temperature 98.4 F (36.9 C), temperature source Oral, resp. rate 18, height 5\' 8"  (1.727 m), weight 121.337 kg (267 lb 8 oz), SpO2 99.00%.   NWG:NFAOZHYQ: 150 A, 155 B bpm Toco: UI with occasional UC. MVH:QIONGEXB: 2.5 Effacement (%): 100 Cervical Position: Anterior Station: -2 Presentation: Vertex Exam by:: R Hamby CNM  A/P- 29 y.o. admitted with preterm labor  and multiple gestation. Preterm labor management: bedrest advised, pelvic rest advised and ultrasound today done for fetal growth, will review report and consult with MD.  Dating:  [redacted]w[redacted]d  HAMBY, Jerita Wimbush 11/28/2011 12:14 PM

## 2011-11-29 ENCOUNTER — Inpatient Hospital Stay (HOSPITAL_COMMUNITY)
Admit: 2011-11-29 | Discharge: 2011-11-29 | Disposition: A | Discharge status: Home / Self Care | Payer: Commercial Managed Care - PPO | Attending: Obstetrics | Admitting: Obstetrics

## 2011-11-29 ENCOUNTER — Encounter (HOSPITAL_COMMUNITY): Payer: Self-pay | Admitting: Maternal and Fetal Medicine

## 2011-11-29 NOTE — Consult Note (Signed)
MATERNAL FETAL MEDICINE CONSULT  Patient Name: Kaitlyn Kennedy Medical Record Number:  [id number] Date of Birth: 13-Feb-1981 Requesting Physician Name:  Roseanna Rainbow, MD Date of Service: 11/29/2011  Chief Complaint Preterm labor/shortened cervix  History of Present Illness Kaitlyn Kennedy is currently hospitalized for preterm labor/shortened cervix in a dichorionic/diamniotic twin pregnancy.  A MFM consult was requested by Roseanna Rainbow, MD.  The patient is a 30 y.o. G1P0000, at [redacted]w[redacted]d today with an EDD of 01/23/2012, by IVF dating.  She has been in the hospital since 11/16/11, but has not had any complaints of contractions, vaginal bleeding, or loss of fluid during her stay.  The patient had an OB ultrasound yesterday that showed appropriate growth of both twins (49% and 61%).   Review of Systems Pertinent items are noted in HPI.  Patient History OB History    Grav Para Term Preterm Abortions TAB SAB Ect Mult Living   1 0 0 0 0 0 0 0 0 0      # Outc Date GA Lbr Len/2nd Wgt Sex Del Anes PTL Lv   1 CUR               Past Medical History  Diagnosis Date  . No pertinent past medical history     Past Surgical History  Procedure Date  . Inguinal hernia repair 1985    History   Social History  . Marital Status: Married    Spouse Name: N/A    Number of Children: N/A  . Years of Education: N/A   Social History Main Topics  . Smoking status: Former Games developer  . Smokeless tobacco: None  . Alcohol Use: No  . Drug Use: No  . Sexually Active: Yes   Other Topics Concern  . None   Social History Narrative  . None    No family history on file. In addition, the patient has no family history of mental retardation, birth defects, or genetic diseases.  Physical Examination Filed Vitals:   11/29/11 0603 11/29/11 0802 11/29/11 1223 11/29/11 1224  BP: 118/60 114/67 144/124 104/49  Pulse: 113 102 118 117  Temp: 98.5 F (36.9 C) 98.5 F (36.9 C) 98.8 F (37.1 C)     TempSrc: Oral Oral Oral   Resp: 20 24 24 24   Height:      Weight:      SpO2:       General appearance - alert, well appearing, and in no distress Abdomen - soft, nontender, nondistended, no masses or organomegaly  Assessment and Recommendations 1.  Preterm labor/cervical shortening.  Although the patient does not feel her contractions, they are registering on toco.  Thus, it is difficult to differentiate between cervical insufficiency and preterm labor.  However, the management is similar regardless of the diagnosis.  She has already completed a course of betamethasone and given that she has been stable for many days now a rescue course is not necessary.  Use of tocolytic therapy past 72 hours to allow for betamethasone administration has not been shown to be effective in delaying delivery and carries significant maternal risks.  Thus, nifedipine should be discontinued at this time.  As the patient has now been stable for several days, it would be reasonable to dismiss her from the hospital as there are no other effective interventions to offer the patient.  In addition, she lives only a short distance from [facility] [place], so she can return quickly if preterm labor resumes.   2.  Twin Gestation.  Ms. Mcbain has a dichorionic diamniotictwin pregnancy.  As a result she will require monthly ultrasounds to assess fetal growth.  In addition, once or twice weekly fetal testing should be begun at 32 weeks and continued until delivery.  An elective delivery should not be undertaken before 38 weeks of gestation, but delivery can be effected at an earlier gestational age if medically or obstetrically indicated.  Rema Fendt

## 2011-11-29 NOTE — Progress Notes (Signed)
Patient ID: Kaitlyn Kennedy, female   DOB: Jun 23, 1981, 30 y.o.   MRN: [id number] S: Preterm labor symptoms: Pt denies contractions, pressure, leaking of fluid. Desires DC home.   O: Blood pressure 118/60, pulse 113, temperature 98.5 F (36.9 C), temperature source Oral, resp. rate 20, height 5\' 8"  (1.727 m), weight 121.337 kg (267 lb 8 oz), SpO2 99.00%.   FHT:145 A, 150 B. FHR reactive.  Toco: UI with 2-3 contractions per hour at times.  WUJ:WJXBJYNW: 2.5 Effacement (%): 100 Cervical Position: Anterior Station: -2 Presentation: Vertex Exam by:: R Hamby CNM  A/P- 30 y.o. admitted with preterm labor  and twins. Preterm labor management: bedrest advised and Procardia 60mg  PO daily. Last ultrasound 11/28/11,  fetal growth 49% and 61%.  Dating:  [redacted]w[redacted]d  HAMBY, REBECCA 11/29/2011 7:53 AM

## 2011-11-30 ENCOUNTER — Inpatient Hospital Stay (HOSPITAL_COMMUNITY): Payer: Commercial Managed Care - PPO | Admitting: Anesthesiology

## 2011-11-30 ENCOUNTER — Encounter (HOSPITAL_COMMUNITY)
Admission: RE | Disposition: A | Discharge status: Home / Self Care | Payer: Self-pay | Source: Ambulatory Visit | Attending: Obstetrics & Gynecology

## 2011-11-30 ENCOUNTER — Encounter (HOSPITAL_COMMUNITY): Payer: Self-pay | Admitting: Anesthesiology

## 2011-11-30 ENCOUNTER — Encounter (HOSPITAL_COMMUNITY): Payer: Self-pay | Admitting: *Deleted

## 2011-11-30 DIAGNOSIS — O42913 Preterm premature rupture of membranes, unspecified as to length of time between rupture and onset of labor, third trimester: Secondary | ICD-10-CM | POA: Diagnosis not present

## 2011-11-30 LAB — CBC
HCT: 34 % — ABNORMAL LOW (ref 36.0–46.0)
Hemoglobin: 11.5 g/dL — ABNORMAL LOW (ref 12.0–15.0)
MCHC: 33.8 g/dL (ref 30.0–36.0)
RBC: 3.78 MIL/uL — ABNORMAL LOW (ref 3.87–5.11)

## 2011-11-30 LAB — ABO/RH: ABO/RH(D): A POS

## 2011-11-30 LAB — RPR: RPR Ser Ql: NONREACTIVE

## 2011-11-30 LAB — TYPE AND SCREEN: Antibody Screen: NEGATIVE

## 2011-11-30 SURGERY — Surgical Case
Wound class: Clean Contaminated

## 2011-11-30 MED ORDER — SODIUM CHLORIDE 0.9 % IV SOLN
2.0000 g | Freq: Four times a day (QID) | INTRAVENOUS | Status: DC
  Filled 2011-11-30 (×2): qty 2000

## 2011-11-30 MED ORDER — SENNOSIDES-DOCUSATE SODIUM 8.6-50 MG PO TABS
2.0000 | ORAL_TABLET | Freq: Every day | ORAL | Status: DC
  Administered 2011-11-30 – 2011-12-01 (×2): 2 via ORAL

## 2011-11-30 MED ORDER — TERBUTALINE SULFATE 1 MG/ML IJ SOLN: 0.2500 mg | Freq: Once | INTRAMUSCULAR | Status: DC | PRN

## 2011-11-30 MED ORDER — IBUPROFEN 600 MG PO TABS: 600.0000 mg | ORAL_TABLET | Freq: Four times a day (QID) | ORAL | Status: DC | PRN

## 2011-11-30 MED ORDER — SODIUM CHLORIDE 0.9 % IV SOLN
2.0000 g | Freq: Four times a day (QID) | INTRAVENOUS | Status: DC
  Administered 2011-11-30 (×2): 2 g via INTRAVENOUS
  Filled 2011-11-30 (×4): qty 2000

## 2011-11-30 MED ORDER — OXYTOCIN 20 UNITS IN LACTATED RINGERS INFUSION - SIMPLE
INTRAVENOUS | Status: DC | PRN
  Administered 2011-11-30: 9 m[IU]/min via INTRAVENOUS

## 2011-11-30 MED ORDER — ONDANSETRON HCL 4 MG/2ML IJ SOLN: 4.0000 mg | Freq: Four times a day (QID) | INTRAMUSCULAR | Status: DC | PRN

## 2011-11-30 MED ORDER — OXYTOCIN BOLUS FROM INFUSION
500.0000 mL | Freq: Once | INTRAVENOUS | Status: DC
  Filled 2011-11-30: qty 500

## 2011-11-30 MED ORDER — ONDANSETRON HCL 4 MG/2ML IJ SOLN: 4.0000 mg | INTRAMUSCULAR | Status: DC | PRN

## 2011-11-30 MED ORDER — LANOLIN HYDROUS EX OINT: TOPICAL_OINTMENT | CUTANEOUS | Status: DC | PRN

## 2011-11-30 MED ORDER — CITRIC ACID-SODIUM CITRATE 334-500 MG/5ML PO SOLN
ORAL | Status: AC
  Filled 2011-11-30: qty 15

## 2011-11-30 MED ORDER — LACTATED RINGERS IV SOLN
INTRAVENOUS | Status: DC
Start: 2011-11-30 — End: 2011-11-30
  Administered 2011-11-30 (×2): via INTRAVENOUS

## 2011-11-30 MED ORDER — CITRIC ACID-SODIUM CITRATE 334-500 MG/5ML PO SOLN: 30.0000 mL | ORAL | Status: DC | PRN

## 2011-11-30 MED ORDER — OXYCODONE-ACETAMINOPHEN 5-325 MG PO TABS: 2.0000 | ORAL_TABLET | ORAL | Status: DC | PRN

## 2011-11-30 MED ORDER — ZOLPIDEM TARTRATE 5 MG PO TABS
5.0000 mg | ORAL_TABLET | Freq: Every evening | ORAL | Status: DC | PRN
  Administered 2011-12-02: 5 mg via ORAL
  Filled 2011-11-30: qty 1

## 2011-11-30 MED ORDER — SODIUM CHLORIDE 0.9 % IJ SOLN: 3.0000 mL | Freq: Two times a day (BID) | INTRAMUSCULAR | Status: DC

## 2011-11-30 MED ORDER — IBUPROFEN 600 MG PO TABS
600.0000 mg | ORAL_TABLET | Freq: Four times a day (QID) | ORAL | Status: DC
  Administered 2011-11-30 – 2011-12-02 (×7): 600 mg via ORAL
  Filled 2011-11-30 (×7): qty 1

## 2011-11-30 MED ORDER — FENTANYL 2.5 MCG/ML BUPIVACAINE 1/10 % EPIDURAL INFUSION (WH - ANES)
INTRAMUSCULAR | Status: DC | PRN
  Administered 2011-11-30: 14 mL/h via EPIDURAL

## 2011-11-30 MED ORDER — OXYTOCIN 20 UNITS IN LACTATED RINGERS INFUSION - SIMPLE
125.0000 mL/h | INTRAVENOUS | Status: DC | PRN
  Filled 2011-11-30: qty 1000

## 2011-11-30 MED ORDER — ACETAMINOPHEN 325 MG PO TABS
650.0000 mg | ORAL_TABLET | ORAL | Status: DC | PRN
  Administered 2011-11-30: 650 mg via ORAL
  Filled 2011-11-30: qty 2

## 2011-11-30 MED ORDER — BENZOCAINE-MENTHOL 20-0.5 % EX AERO: 1.0000 "application " | INHALATION_SPRAY | CUTANEOUS | Status: DC | PRN

## 2011-11-30 MED ORDER — EPHEDRINE 5 MG/ML INJ
10.0000 mg | INTRAVENOUS | Status: DC | PRN
  Filled 2011-11-30: qty 4

## 2011-11-30 MED ORDER — OXYTOCIN 20 UNITS IN LACTATED RINGERS INFUSION - SIMPLE: 125.0000 mL/h | Freq: Once | INTRAVENOUS | Status: DC

## 2011-11-30 MED ORDER — DIBUCAINE 1 % RE OINT: 1.0000 "application " | TOPICAL_OINTMENT | RECTAL | Status: DC | PRN

## 2011-11-30 MED ORDER — TETANUS-DIPHTH-ACELL PERTUSSIS 5-2.5-18.5 LF-MCG/0.5 IM SUSP
0.5000 mL | Freq: Once | INTRAMUSCULAR | Status: DC
  Filled 2011-11-30: qty 0.5

## 2011-11-30 MED ORDER — AMOXICILLIN 500 MG PO CAPS: 500.0000 mg | ORAL_CAPSULE | Freq: Three times a day (TID) | ORAL | Status: DC

## 2011-11-30 MED ORDER — LIDOCAINE HCL (PF) 1 % IJ SOLN
30.0000 mL | INTRAMUSCULAR | Status: DC | PRN
  Filled 2011-11-30: qty 30

## 2011-11-30 MED ORDER — LIDOCAINE HCL (PF) 1 % IJ SOLN
INTRAMUSCULAR | Status: AC
  Filled 2011-11-30: qty 30

## 2011-11-30 MED ORDER — FERROUS SULFATE 325 (65 FE) MG PO TABS
325.0000 mg | ORAL_TABLET | Freq: Two times a day (BID) | ORAL | Status: DC
  Administered 2011-12-01 – 2011-12-02 (×3): 325 mg via ORAL
  Filled 2011-11-30 (×3): qty 1

## 2011-11-30 MED ORDER — SODIUM CHLORIDE 0.9 % IV SOLN: 250.0000 mL | INTRAVENOUS | Status: DC | PRN

## 2011-11-30 MED ORDER — OXYTOCIN 20 UNITS IN LACTATED RINGERS INFUSION - SIMPLE
1.0000 m[IU]/min | INTRAVENOUS | Status: DC
  Administered 2011-11-30: 1 m[IU]/min via INTRAVENOUS
  Filled 2011-11-30: qty 1000

## 2011-11-30 MED ORDER — PRENATAL MULTIVITAMIN CH
1.0000 | ORAL_TABLET | Freq: Every day | ORAL | Status: DC
  Administered 2011-12-01 – 2011-12-02 (×2): 1 via ORAL
  Filled 2011-11-30 (×2): qty 1

## 2011-11-30 MED ORDER — LACTATED RINGERS IV SOLN
INTRAVENOUS | Status: DC
  Administered 2011-11-30: 08:00:00 via INTRAVENOUS
  Administered 2011-11-30: 500 mL via INTRAVENOUS

## 2011-11-30 MED ORDER — AZITHROMYCIN 1 G PO PACK
1.0000 g | PACK | Freq: Once | ORAL | Status: AC
  Administered 2011-11-30: 1 g via ORAL
  Filled 2011-11-30: qty 1

## 2011-11-30 MED ORDER — EPHEDRINE 5 MG/ML INJ: 10.0000 mg | INTRAVENOUS | Status: DC | PRN

## 2011-11-30 MED ORDER — FLEET ENEMA 7-19 GM/118ML RE ENEM: 1.0000 | ENEMA | RECTAL | Status: DC | PRN

## 2011-11-30 MED ORDER — DIPHENHYDRAMINE HCL 50 MG/ML IJ SOLN: 12.5000 mg | INTRAMUSCULAR | Status: DC | PRN

## 2011-11-30 MED ORDER — MISOPROSTOL 200 MCG PO TABS
800.0000 ug | ORAL_TABLET | Freq: Once | ORAL | Status: AC
  Administered 2011-11-30: 800 ug via VAGINAL

## 2011-11-30 MED ORDER — MISOPROSTOL 100 MCG PO TABS
ORAL_TABLET | ORAL | Status: DC | PRN
  Administered 2011-11-30: 200 ug via RECTAL

## 2011-11-30 MED ORDER — SODIUM BICARBONATE 8.4 % IV SOLN
INTRAVENOUS | Status: DC | PRN
  Administered 2011-11-30: 4 mL via EPIDURAL

## 2011-11-30 MED ORDER — PHENYLEPHRINE 40 MCG/ML (10ML) SYRINGE FOR IV PUSH (FOR BLOOD PRESSURE SUPPORT): 80.0000 ug | PREFILLED_SYRINGE | INTRAVENOUS | Status: DC | PRN

## 2011-11-30 MED ORDER — LACTATED RINGERS IV SOLN
INTRAVENOUS | Status: DC | PRN
  Administered 2011-11-30: 18:00:00 via INTRAVENOUS

## 2011-11-30 MED ORDER — PHENYLEPHRINE 40 MCG/ML (10ML) SYRINGE FOR IV PUSH (FOR BLOOD PRESSURE SUPPORT)
80.0000 ug | PREFILLED_SYRINGE | INTRAVENOUS | Status: DC | PRN
  Administered 2011-11-30: 120 ug via INTRAVENOUS
  Filled 2011-11-30: qty 5

## 2011-11-30 MED ORDER — SODIUM CHLORIDE 0.9 % IJ SOLN: 3.0000 mL | INTRAMUSCULAR | Status: DC | PRN

## 2011-11-30 MED ORDER — WITCH HAZEL-GLYCERIN EX PADS: 1.0000 "application " | MEDICATED_PAD | CUTANEOUS | Status: DC | PRN

## 2011-11-30 MED ORDER — SIMETHICONE 80 MG PO CHEW: 80.0000 mg | CHEWABLE_TABLET | ORAL | Status: DC | PRN

## 2011-11-30 MED ORDER — ONDANSETRON HCL 4 MG PO TABS: 4.0000 mg | ORAL_TABLET | ORAL | Status: DC | PRN

## 2011-11-30 MED ORDER — LACTATED RINGERS IV SOLN
500.0000 mL | INTRAVENOUS | Status: DC | PRN
  Administered 2011-11-30: 500 mL via INTRAVENOUS

## 2011-11-30 MED ORDER — OXYCODONE-ACETAMINOPHEN 5-325 MG PO TABS
1.0000 | ORAL_TABLET | ORAL | Status: DC | PRN
  Administered 2011-12-01 (×3): 1 via ORAL
  Filled 2011-11-30 (×4): qty 1

## 2011-11-30 MED ORDER — DIPHENHYDRAMINE HCL 25 MG PO CAPS: 25.0000 mg | ORAL_CAPSULE | Freq: Four times a day (QID) | ORAL | Status: DC | PRN

## 2011-11-30 MED ORDER — LACTATED RINGERS IV SOLN: 500.0000 mL | Freq: Once | INTRAVENOUS | Status: DC

## 2011-11-30 MED ORDER — FENTANYL 2.5 MCG/ML BUPIVACAINE 1/10 % EPIDURAL INFUSION (WH - ANES)
14.0000 mL/h | INTRAMUSCULAR | Status: DC
  Administered 2011-11-30: 14 mL/h via EPIDURAL
  Filled 2011-11-30 (×2): qty 60

## 2011-11-30 SURGICAL SUPPLY — 39 items
ADH SKN CLS APL DERMABOND .7 (GAUZE/BANDAGES/DRESSINGS)
CANISTER WOUND CARE 500ML ATS (WOUND CARE) IMPLANT
CHLORAPREP W/TINT 26ML (MISCELLANEOUS) ×1 IMPLANT
CLOTH BEACON ORANGE TIMEOUT ST (SAFETY) ×3 IMPLANT
CONTAINER PREFILL 10% NBF 15ML (MISCELLANEOUS) ×6 IMPLANT
DERMABOND ADVANCED (GAUZE/BANDAGES/DRESSINGS)
DERMABOND ADVANCED .7 DNX12 (GAUZE/BANDAGES/DRESSINGS) ×1 IMPLANT
DRSG VAC ATS LRG SENSATRAC (GAUZE/BANDAGES/DRESSINGS) IMPLANT
DRSG VAC ATS MED SENSATRAC (GAUZE/BANDAGES/DRESSINGS) IMPLANT
DRSG VAC ATS SM SENSATRAC (GAUZE/BANDAGES/DRESSINGS) IMPLANT
ELECT REM PT RETURN 9FT ADLT (ELECTROSURGICAL) ×3
ELECTRODE REM PT RTRN 9FT ADLT (ELECTROSURGICAL) ×2 IMPLANT
EXTRACTOR VACUUM M CUP 4 TUBE (SUCTIONS) IMPLANT
GLOVE BIO SURGEON STRL SZ8 (GLOVE) ×6 IMPLANT
GOWN PREVENTION PLUS LG XLONG (DISPOSABLE) ×4 IMPLANT
GOWN PREVENTION PLUS XLARGE (GOWN DISPOSABLE) ×3 IMPLANT
KIT ABG SYR 3ML LUER SLIP (SYRINGE) IMPLANT
NDL HYPO 25X5/8 SAFETYGLIDE (NEEDLE) ×1 IMPLANT
NEEDLE HYPO 25X5/8 SAFETYGLIDE (NEEDLE) ×3 IMPLANT
NS IRRIG 1000ML POUR BTL (IV SOLUTION) ×1 IMPLANT
PACK C SECTION WH (CUSTOM PROCEDURE TRAY) ×3 IMPLANT
RTRCTR C-SECT PINK 25CM LRG (MISCELLANEOUS) IMPLANT
SLEEVE SCD COMPRESS KNEE MED (MISCELLANEOUS) IMPLANT
STAPLER VISISTAT 35W (STAPLE) IMPLANT
SUT GUT PLAIN 0 CT-3 TAN 27 (SUTURE) ×3 IMPLANT
SUT MNCRL 0 VIOLET CTX 36 (SUTURE) ×6 IMPLANT
SUT MNCRL AB 4-0 PS2 18 (SUTURE) IMPLANT
SUT MON AB 2-0 CT1 27 (SUTURE) ×3 IMPLANT
SUT MON AB 3-0 SH 27 (SUTURE)
SUT MON AB 3-0 SH27 (SUTURE) IMPLANT
SUT MONOCRYL 0 CTX 36 (SUTURE) ×3
SUT PLAIN 2 0 XLH (SUTURE) IMPLANT
SUT VIC AB 0 CTX 36 (SUTURE) ×6
SUT VIC AB 0 CTX36XBRD ANBCTRL (SUTURE) ×4 IMPLANT
SUT VIC AB 2-0 CT1 27 (SUTURE)
SUT VIC AB 2-0 CT1 TAPERPNT 27 (SUTURE) IMPLANT
TOWEL OR 17X24 6PK STRL BLUE (TOWEL DISPOSABLE) ×6 IMPLANT
TRAY FOLEY CATH 14FR (SET/KITS/TRAYS/PACK) ×1 IMPLANT
WATER STERILE IRR 1000ML POUR (IV SOLUTION) ×1 IMPLANT

## 2011-11-30 NOTE — Anesthesia Procedure Notes (Signed)
Epidural Patient location during procedure: OB  Preanesthetic Checklist Completed: patient identified, site marked, surgical consent, pre-op evaluation, timeout performed, IV checked, risks and benefits discussed and monitors and equipment checked  Epidural Patient position: sitting Prep: site prepped and draped and DuraPrep Patient monitoring: continuous pulse ox and blood pressure Approach: midline Injection technique: LOR air  Needle:  Needle type: Tuohy  Needle gauge: 17 G Needle length: 9 cm Needle insertion depth: 8 cm Catheter type: closed end flexible Catheter size: 19 Gauge Test dose: negative  Assessment Events: blood not aspirated, injection not painful, no injection resistance, negative IV test and no paresthesia  Additional Notes Dosing of Epidural:  1st dose, through needle ............................................. epi 1:200K + Xylocaine 40 mg  2nd dose, through catheter, after waiting 3 minutes.....epi 1:200K + Xylocaine 40 mg  3rd dose, through catheter after waiting 3 minutes .............................Marcaine   4mg   ( mg Marcaine are expressed as equivilent  cc's medication removed from the 0.1%Bupiv / fentanyl syringe from L&D pump)  ( 2% Xylo charted as a single dose in Epic Meds for ease of charting; actual dosing was fractionated as above, for saftey's sake)  As each dose occurred, patient was free of IV sx; and patient exhibited no evidence of SA injection.  Patient is more comfortable after epidural dosed. Please see RN's note for documentation of vital signs,and FHR which are stable.    

## 2011-11-30 NOTE — Progress Notes (Signed)
Amnisure test done. Sent to lab. Awating results.

## 2011-11-30 NOTE — Consult Note (Signed)
Had arrived after late notification for delivery of twins at 32.[redacted] weeks gestation with immediate past history of mother being an in -patient for past several weeks and experiencing PPROM this AM @ ~ 0430 hrs. Made aware at the time of delivery that  twin designated as "A" prior to delivery had been in vertex and twin designated as "B" prior to delivery had been in transverse lie. Peruse OB note as to maneuvers made during the latter twin's delivery.Mother had received BMZ by verbal report during this hospitalization. No other risk factors other than prematurity and multiple gestation were reported.    Twin B was delivered by vertex after being manipulated from a transverse lie based on the prior verbal report.  She was placed under radiant warmer, had good tone and color on ambient oxygen and was given tactile stim with drying and bulb suction to nares and oropharynx.  Cap placed on head and lungs auscultated and were considered clear with no signs of decreased lung compliance.  Infant voided after this time and before 5 minutes of age, receiving good Apgar score of 8 at one minute and 9  at 5 minutes.   There were no dysmorphic features noted. Infant placed into transport warmer and transported with sibling to NICU with no interval problems .    Dagoberto Ligas MD [facility]  [facility]

## 2011-11-30 NOTE — Progress Notes (Signed)
MD made aware of positive amnisure test result. Advised to hold pt in antenatal for the moment until laboring.

## 2011-11-30 NOTE — Anesthesia Postprocedure Evaluation (Signed)
  Anesthesia Post-op Note  Patient: Kaitlyn Kennedy  Twins delivered Back to L&D for Recovery

## 2011-11-30 NOTE — Progress Notes (Signed)
Kaitlyn Kennedy is a 30 y.o. G1P0000 at [redacted]w[redacted]d by ultrasound admitted for PTL.  Subjective:   Objective: BP 93/76  Pulse 130  Temp(Src) 98.5 F (36.9 C) (Oral)  Resp 18  Ht 5\' 8"  (1.727 m)  Wt 121.337 kg (267 lb 8 oz)  BMI 40.67 kg/m2  SpO2 100% I/O last 3 completed shifts: In: 240 [P.O.:240] Out: -     FHT:  FHR: 140 bpm, variability: moderate,  accelerations:  Present,  decelerations:  Absent UC:   regular, every 3 minutes SVE:   Dilation: 10 Effacement (%): 100 Station: +1 Exam by:: Montez Morita, RNC  Labs: Lab Results  Component Value Date   WBC 11.0* 11/30/2011   HGB 11.5* 11/30/2011   HCT 34.0* 11/30/2011   MCV 89.9 11/30/2011   PLT 218 11/30/2011    Assessment / Plan: Augmentation of labor, progressing well  Labor: Progressing on Pitocin, will continue to increase then AROM Preeclampsia:  n/a Fetal Wellbeing:  Category I Pain Control:  Epidural I/D:  n/a Anticipated MOD:  NSVD  Cheetara Hoge A 11/30/2011, 6:26 PM

## 2011-11-30 NOTE — Consult Note (Signed)
Made aware of impending delivery in OR as a double set  Up of 32.[redacted] week gestation twin female infants with twin designated as "A" prior to delivery in vertex and twin designated as "B" prior to delivery in transverse lie. Peruse OB note as to maneuvers made during the latter twin's delivery.  Recent history is that mother has been in-patient for past 2+ weeks and the PPROM occurred this AM ~ 0430 hrs with subsequent delivery > 12 hours later.  She had received BMZ by verbal report during this hospitalization. No other risk factors other than prematurity and multiple gestation were reported.   Team was notified of Twin A's delivery while awaiting a call to attend the double set up.  Arrived with twin A approximately 2 1/2-3 minutes of age, crying and being shown to mother while wrapped in a warm blanket.  Placed under radiant warmer, infant had good tone and color on ambient oxygen and had already been dried. Cap placed on head and lungs auscultated and were considered clear with no signs of decreased lung compliance.   Infant voided after this time and before 5 minutes of age, receiving good Apgar score of 8 at 5 minutes. The one minute Apgar score is to be entered by the mother's L/D RN  There were no dysmorphic features noted.  Infant placed into transport warmer and transported with sibling to NICU with no interval problems .   Dagoberto Ligas MD [facility]

## 2011-11-30 NOTE — Anesthesia Preprocedure Evaluation (Signed)
Anesthesia Evaluation  Patient identified by MRN, date of birth, ID band Patient awake    Reviewed: Allergy & Precautions, H&P , Patient's Chart, lab work & pertinent test results  Airway Mallampati: II TM Distance: >3 FB Neck ROM: full    Dental  (+) Teeth Intact   Pulmonary  clear to auscultation        Cardiovascular regular Normal    Neuro/Psych    GI/Hepatic   Endo/Other  Morbid obesity  Renal/GU      Musculoskeletal   Abdominal   Peds  Hematology   Anesthesia Other Findings       Reproductive/Obstetrics (+) Pregnancy                           Anesthesia Physical Anesthesia Plan  ASA: III  Anesthesia Plan: Epidural   Post-op Pain Management:    Induction:   Airway Management Planned:   Additional Equipment:   Intra-op Plan:   Post-operative Plan:   Informed Consent: I have reviewed the patients History and Physical, chart, labs and discussed the procedure including the risks, benefits and alternatives for the proposed anesthesia with the patient or authorized representative who has indicated his/her understanding and acceptance.   Dental Advisory Given  Plan Discussed with:   Anesthesia Plan Comments: (Labs checked- platelets confirmed with RN in room. Fetal heart tracing, per RN, reported to be stable enough for sitting procedure. Discussed epidural, and patient consents to the procedure:  included risk of possible headache,backache, failed block, allergic reaction, and nerve injury. This patient was asked if she had any questions or concerns before the procedure started. )        Anesthesia Quick Evaluation  

## 2011-11-30 NOTE — Progress Notes (Signed)
Pt states she had "wet underwear" when she woke up. She states that she had voided prior to this about 20 minutes ago. Underwear is saturated and visible fluid running down legs noted at bedside.

## 2011-11-30 NOTE — Progress Notes (Signed)
MD made aware of pt status: positive fern test. New order given to an amnisure.

## 2011-11-30 NOTE — Progress Notes (Signed)
Fern test completed. Results: positive.

## 2011-11-30 NOTE — Progress Notes (Signed)
Patient ID: Kaitlyn Kennedy, female   DOB: January 30, 1981, 30 y.o.   MRN: [id number] S: Preterm labor symptoms: fluid leakage  O: Blood pressure 127/69, pulse 108, temperature 98.7 F (37.1 C), temperature source Oral, resp. rate 20, height 5\' 8"  (1.727 m), weight 121.337 kg (267 lb 8 oz), SpO2 99.00%.   WUJ:WJXBJYNWGN x 2 Toco: None FAO:ZHYQMVHQ: 2.5 Effacement (%): 100 Cervical Position: Anterior Station: -2 Presentation: Vertex Exam by:: R Hamby CNM  A/P- 30 y.o. admitted with preterm labor  and PPROM, twin gestation Preterm labor management: bedrest advised and antibiotics to prolong latency; d/c Procardia Dating:  [redacted]w[redacted]d  FWB:  Continuous FHT for now  ROD: spontaneous vaginal

## 2011-11-30 NOTE — Transfer of Care (Signed)
Immediate Anesthesia Transfer of Care Note  Patient: Kaitlyn Kennedy  Procedure(s) Performed:  CESAREAN SECTION  Patient Location: OR  Anesthesia Type: Epidural  Level of Consciousness: alert  and oriented  Airway & Oxygen Therapy: Patient Spontanous Breathing  Post-op Assessment: Post -op Vital signs reviewed and stable  Post vital signs: stable  Complications: No apparent anesthesia complications

## 2011-12-01 NOTE — Progress Notes (Signed)
Patient ID: Kaitlyn Kennedy, female   DOB: 26-Sep-1981, 30 y.o.   MRN: [id number] Post Partum Day 1 S/P augmented vaginal RH status/Rubella reviewed.  Feeding: breast Subjective: No HA, SOB, CP, F/C, breast symptoms. Normal vaginal bleeding, no clots.     Objective: BP 136/78  Pulse 120  Temp(Src) 99.1 F (37.3 C) (Oral)  Resp 19  Ht 5\' 8"  (1.727 m)  Wt 121.337 kg (267 lb 8 oz)  BMI 40.67 kg/m2  SpO2 99%  Breastfeeding? Unknown   Physical Exam:  General: alert Lochia: appropriate Uterine Fundus: firm DVT Evaluation: No evidence of DVT seen on physical exam. Ext: No c/c/e  Basename 11/30/11 0920  HGB 11.5*  HCT 34.0*      Assessment/Plan: 30 y.o.  PPD #1 .  normal postpartum exam Continue current postpartum care  Ambulate   LOS: 15 days   JACKSON-MOORE,Pranshu Lyster A 12/01/2011, 8:03 AM

## 2011-12-01 NOTE — Progress Notes (Signed)
UR Chart review completed.  

## 2011-12-01 NOTE — Anesthesia Postprocedure Evaluation (Signed)
  Anesthesia Post-op Note  Patient: Kaitlyn Kennedy  Procedure(s) Performed:  CESAREAN SECTION  Patient Location: Women[facility] Unit  Anesthesia Type: Epidural  Level of Consciousness: awake, alert  and oriented  Airway and Oxygen Therapy: Patient Spontanous Breathing  Post-op Pain: none  Post-op Assessment: Post-op Vital signs reviewed, Patient's Cardiovascular Status Stable, No headache and No backache  Post-op Vital Signs: Reviewed and stable  Complications: No apparent anesthesia complications

## 2011-12-01 NOTE — Addendum Note (Signed)
Addendum  created 12/01/11 0813 by Madison Hickman   Modules edited:Notes Section

## 2011-12-02 ENCOUNTER — Encounter (HOSPITAL_COMMUNITY)
Admission: RE | Admit: 2011-12-02 | Discharge: 2011-12-02 | Disposition: A | Discharge status: Home / Self Care | Payer: Commercial Managed Care - PPO | Source: Ambulatory Visit | Attending: Obstetrics | Admitting: Obstetrics

## 2011-12-02 DIAGNOSIS — O923 Agalactia: Secondary | ICD-10-CM | POA: Insufficient documentation

## 2011-12-02 MED ORDER — IBUPROFEN 600 MG PO TABS: 600.0000 mg | ORAL_TABLET | Freq: Four times a day (QID) | ORAL | Status: AC

## 2011-12-02 MED ORDER — ZOLPIDEM TARTRATE 5 MG PO TABS: 5.0000 mg | ORAL_TABLET | Freq: Every evening | ORAL | Status: DC | PRN

## 2011-12-02 MED ORDER — FERROUS SULFATE 325 (65 FE) MG PO TABS: 325.0000 mg | ORAL_TABLET | Freq: Two times a day (BID) | ORAL | Status: DC

## 2011-12-02 NOTE — Discharge Summary (Signed)
Obstetric Discharge Summary Reason for Admission: Preterm labor, twin gestation Prenatal Procedures: NST and ultrasound Intrapartum Procedures: spontaneous vaginal delivery, rotation of baby B transverse to vertex and IV antibiotics for PPROM Postpartum Procedures: none Complications-Operative and Postpartum: none Hemoglobin  Date Value Range Status  11/30/2011 11.5* 12.0-15.0 (g/dL) Final     HCT  Date Value Range Status  11/30/2011 34.0* 36.0-46.0 (%) Final    Discharge Diagnoses: Premature labor, PROM x 12 hours and twin gestation  Discharge Information: Date: 12/02/2011 Activity: pelvic rest Diet: routine Medications: PNV, Ibuprofen, Iron and ambien Condition: stable Instructions: see written DC instructions Discharge to: home Follow-up Information    Follow up with HARPER,CHARLES A, MD in 6 weeks.   Contact information:   [address] [place] [address] [phone]          Newborn Data:   Deronda, Christian [[id number]]  Live born female  Birth Weight: 3 lb 15 oz (1786 g) APGAR: 8, 8   Madora, Barletta [[id number]]  Live born female  Birth Weight: 3 lb 9.3 oz (1624 g) APGAR: 8, 9  Infants in NICU  HAMBY, REBECCA 12/02/2011, 7:24 AM

## 2011-12-02 NOTE — Progress Notes (Signed)
PSYCHOSOCIAL ASSESSMENT ~ MATERNAL/CHILD Name:  Kaitlyn Kennedy and Kaitlyn Kennedy         Age: 28 day    Referral Date :  12/01/11   Reason/Source: NICU Admission I. FAMILY/HOME ENVIRONMENT A. Child's Legal Guardian X Parent(s)  Name:  Kaitlyn Kennedy   DOB: 1981/06/01  Age: [address]     Address: [address], [place], [place], [address]  B. Other Household Members/Support Persons   Extended family support C.   Other Support:  Friends, employer  II. PSYCHOSOCIAL DATA A. Information Source X Patient Interview  X Family Interview            B. 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 Cultural and Environment Information Cultural Issues Impacting Care:  N/A III. STRENGTHS XSupportive family/friends   X Adequate Resources  X Compliance with medical plan  X Home prepared for Child (including basic supplies)                 X Understanding of illness            IV. RISK FACTORS AND CURRENT PROBLEMS       X No Problems Noted                        V. SOCIAL WORK ASSESSMENT Met with MOB at bedside to assess strengths, needs and coping following NICU admission of her newborn twins.  MOB has been in-patient since 11/16/2011.  She has been on leave from her job since 09/15/2011.  MOB is appropriately concerned about her babies and is tearful some when talking about not having babies come home with her at this time.  MOB and FOB have a multitude of family and friends to support them.  MOB livened up when discussion her extended family and the support they have continued to provide.  MOB and FOB opted to allow paternal and maternal grandmothers on the NICU list for independent visiting.  They will be on hand for visits when other family members come.  MOB is very happy with the arrival of her first babies.  FOB has taken 12 weeks of fully paid paternity leave  and MOB is very excited about this.  MOB and FOB are unsure about MOB returning to work first once her leave is up on February 23, 2012.  FOB prefers MOB to stay home with their daughters.  MOB will contemplate this further as the time draws near.  She is focused on caring for her babies and recovering from the birth.  MOB reports she is doing very well in her post-op recovery.  She was walking around with ease and able to take care of things herself.  She is providing her babies with breast milk and MOB reports pumping is going very well.  She has been able to provide her babies with adequate breast milk. MOB is on her husband's insurance, and she is very pleased with the benefits they have been able to access during and after the delivery.  She is hopeful that babies will soon join their family at home.  MOB is happy, optimistic, hopeful and very much enjoying her role as new mother.  She has already had 3 baby showers  and reports having plenty of supplies.  MOB did not have any immediate needs.  She understands she can contact us during the course of her babies stay.    VI. SOCIAL WORK PLAN X No Further Intervention Required/No Barriers to Discharge X Patient/Family Education:  NICU supports/NICU brochure  Staci Acosta, LCSW  12/02/11, 11:14 am

## 2011-12-02 NOTE — Progress Notes (Signed)
Post Partum Day 2: S/P SVD of twins at 32.2 weeks Subjective: no complaints, up ad lib, voiding, tolerating PO, + flatus and desires DC home today. Denies HA, RUQ pain, or visual disturbances.  Objective: Blood pressure 144/83, pulse 88, temperature 98 F (36.7 C), temperature source Oral, resp. rate 18, height 5\' 8"  (1.727 m), weight 121.337 kg (267 lb 8 oz), SpO2 99.00%, unknown if currently breastfeeding.  Physical Exam:  General: alert, cooperative, appears stated age and no distress Lochia: appropriate Uterine Fundus: firm Incision: none DVT Evaluation: No evidence of DVT seen on physical exam. Negative Homan's sign. No cords or calf tenderness. No significant calf/ankle edema.   Basename 11/30/11 0920  HGB 11.5*  HCT 34.0*    Assessment/Plan: Discharge home and Contraception undecided. Babies in NICU.   LOS: 16 days   HAMBY, Kearstin Learn 12/02/2011, 7:17 AM

## 2011-12-02 NOTE — Progress Notes (Signed)
Pt d/c home with family via ambulatory in private car.

## 2011-12-04 NOTE — Progress Notes (Signed)
Retro clinical review completed.

## 2011-12-11 ENCOUNTER — Encounter (HOSPITAL_COMMUNITY): Payer: Self-pay | Admitting: Obstetrics

## 2012-01-02 ENCOUNTER — Encounter (HOSPITAL_COMMUNITY)
Admission: RE | Admit: 2012-01-02 | Discharge: 2012-01-02 | Disposition: A | Discharge status: Home / Self Care | Payer: Commercial Managed Care - PPO | Source: Ambulatory Visit | Attending: Obstetrics | Admitting: Obstetrics

## 2012-01-02 DIAGNOSIS — O923 Agalactia: Secondary | ICD-10-CM | POA: Insufficient documentation

## 2012-07-25 IMAGING — US US OB TRANSVAGINAL
1 series · 14 of 25 positions shown · non-contrast
Comparison: none

[Series 1: us ob transvaginal · 14 of 25 slices shown]
[im 1/25]
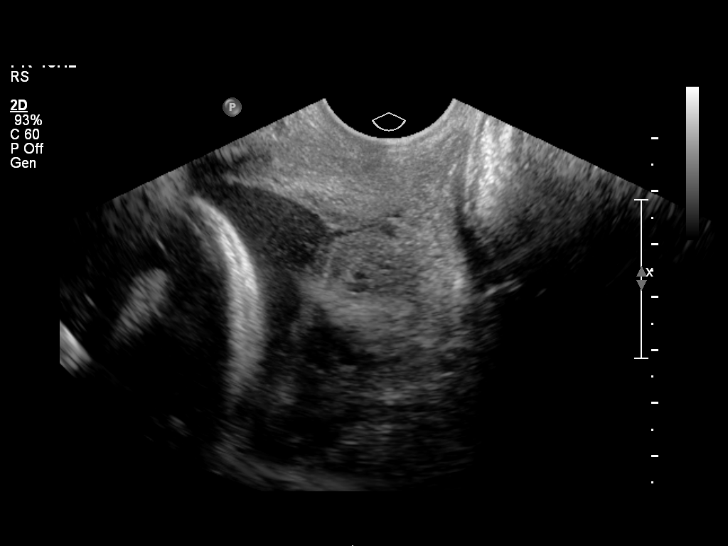
[im 3/25]
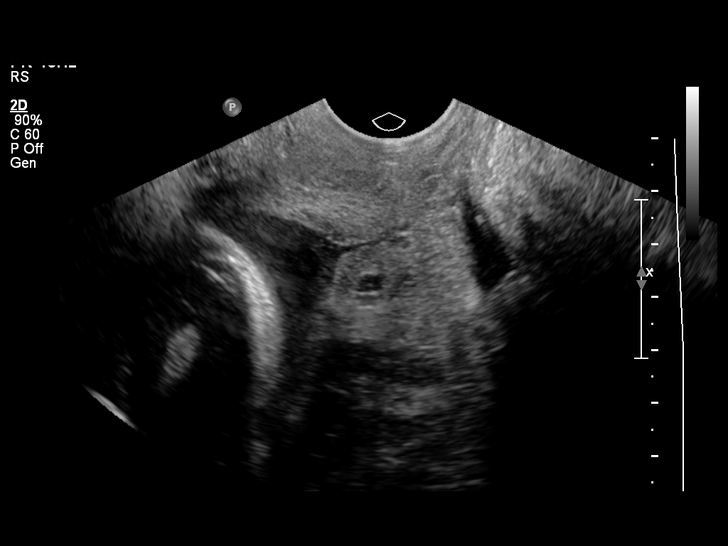
[im 5/25]
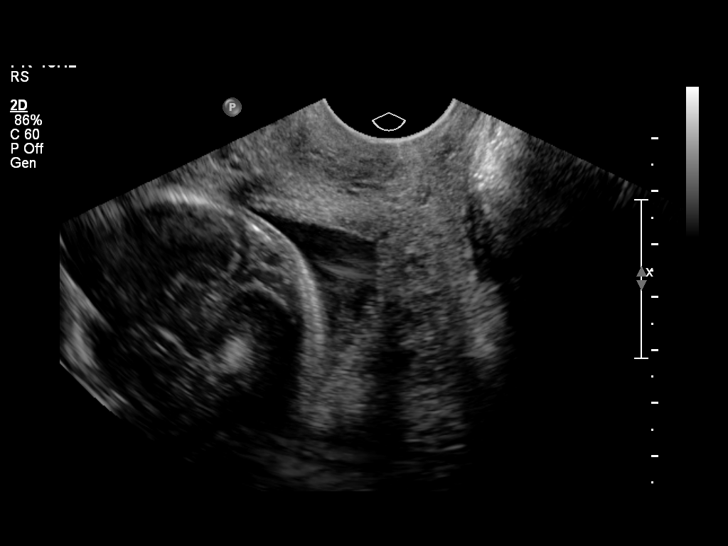
[im 7/25]
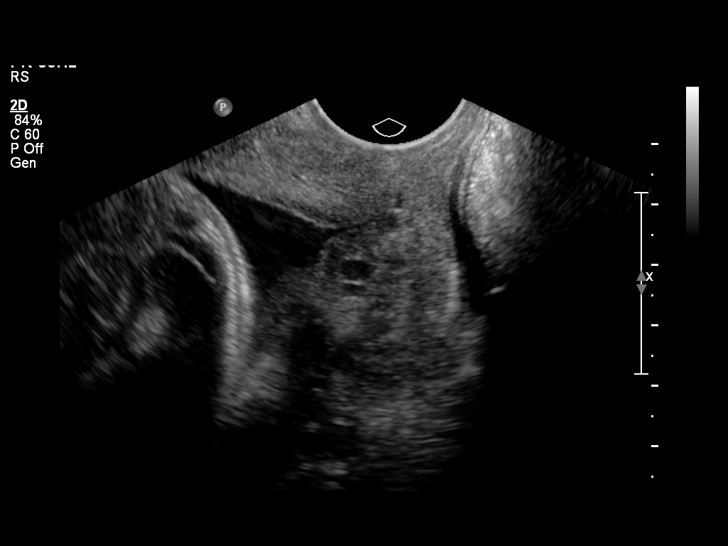
[im 9/25]
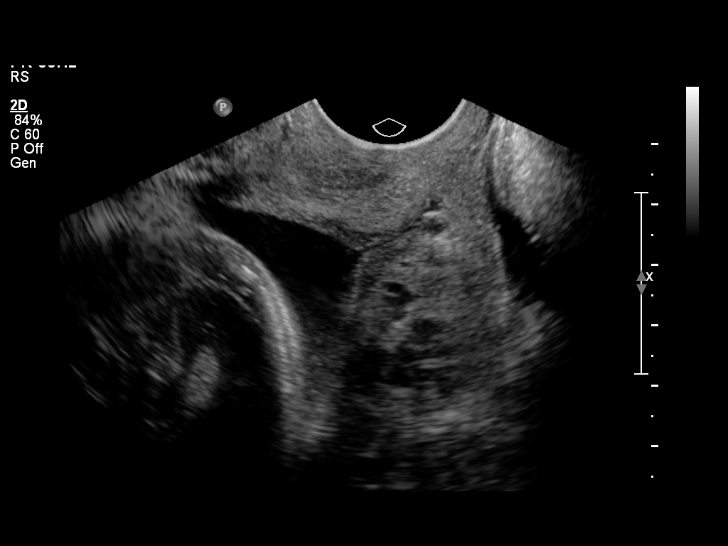
[im 10/25]
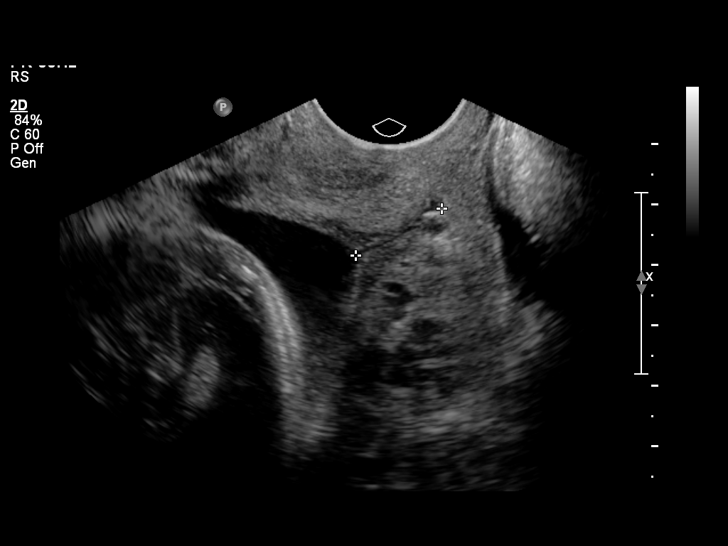
[im 12/25]
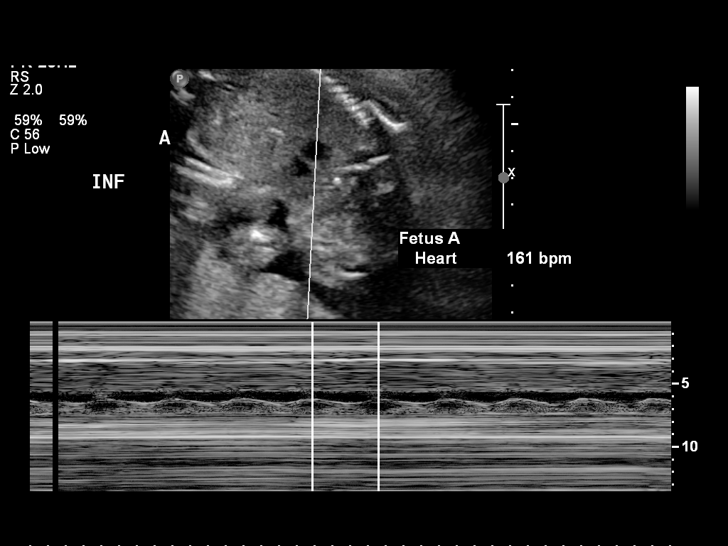
[im 14/25]
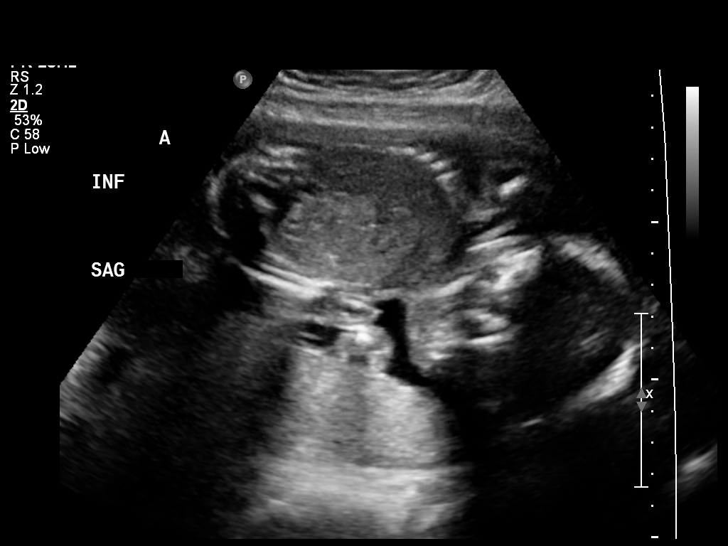
[im 16/25]
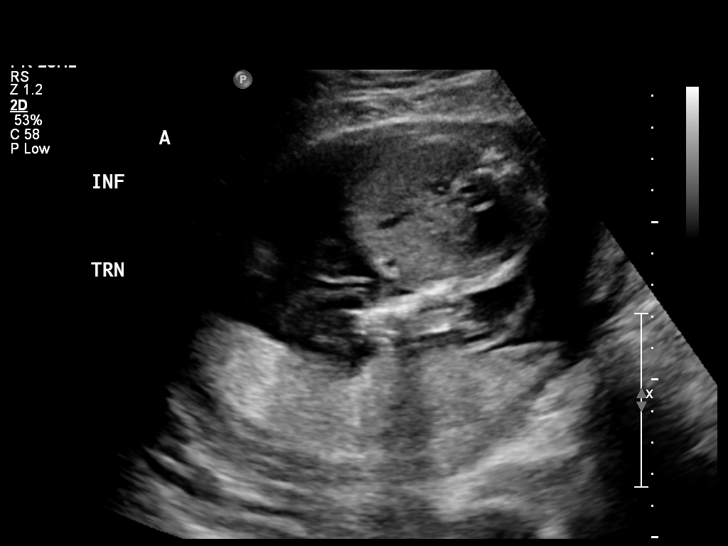
[im 17/25]
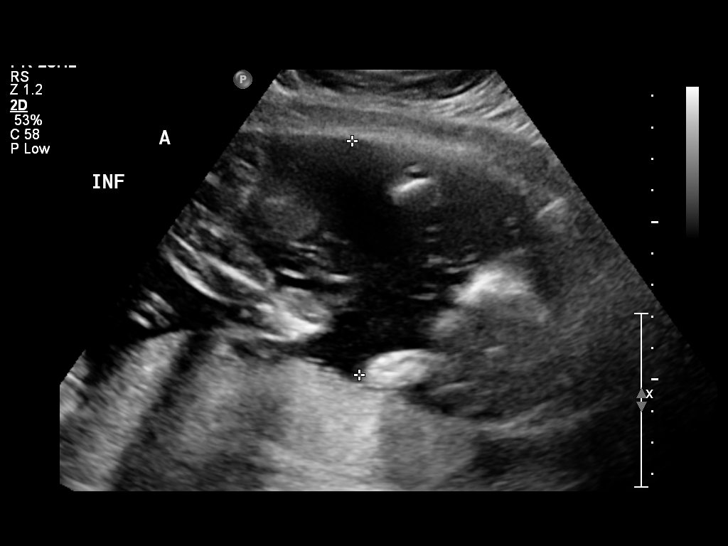
[im 19/25]
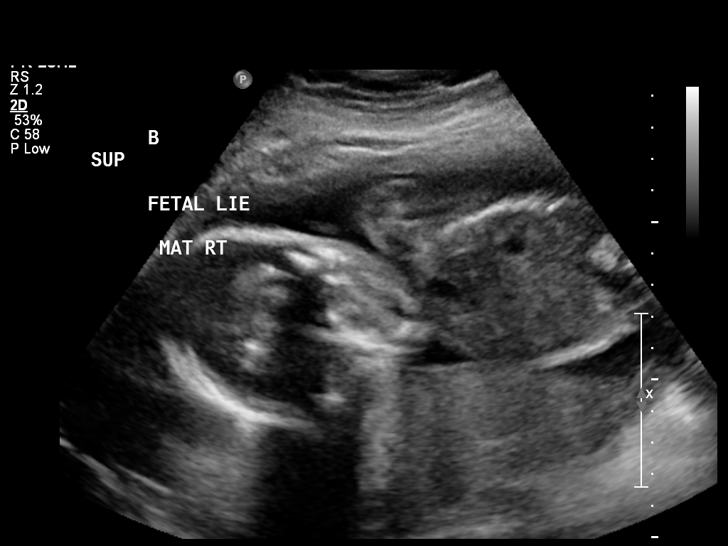
[im 21/25]
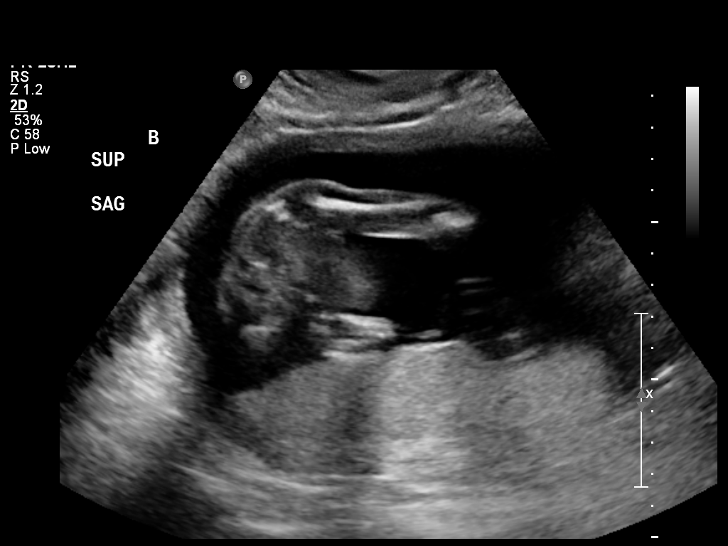
[im 23/25]
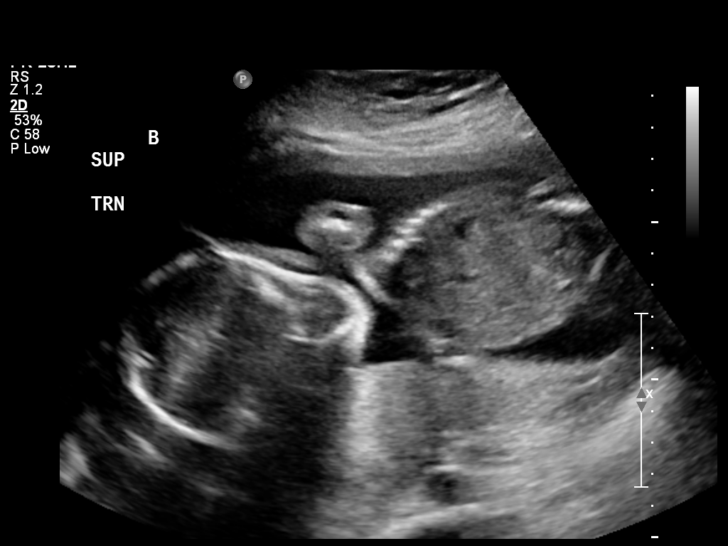
[im 25/25]
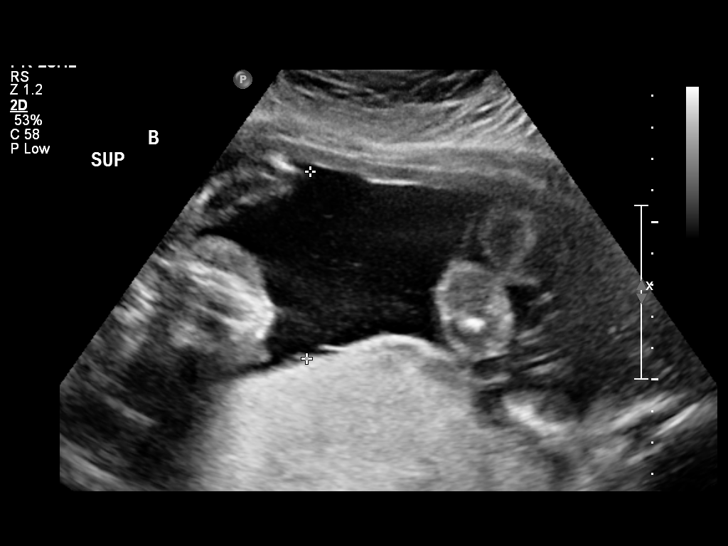

[14 of 25 positions shown; findings below may reference images not displayed]

Canned report from images found in remote index.

Refer to host system for actual result text.

## 2012-08-01 IMAGING — US US OB TRANSVAGINAL
1 series · 14 of 22 positions shown · non-contrast
Comparison: none

[Series 1: us ob transvaginal · 0.21mm/px · 14 of 22 slices shown]
[im 1/22]
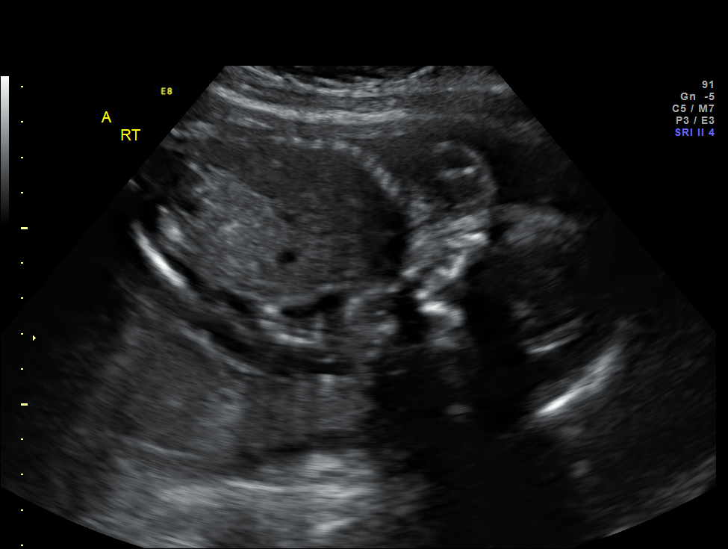
[im 3/22]
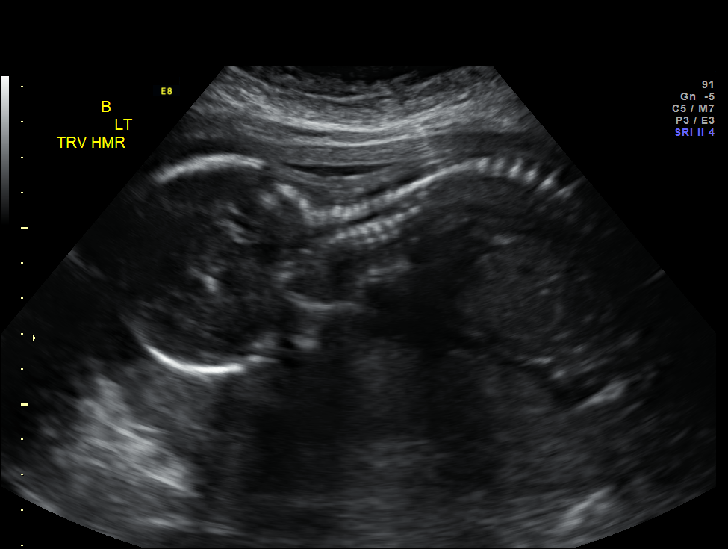
[im 4/22]
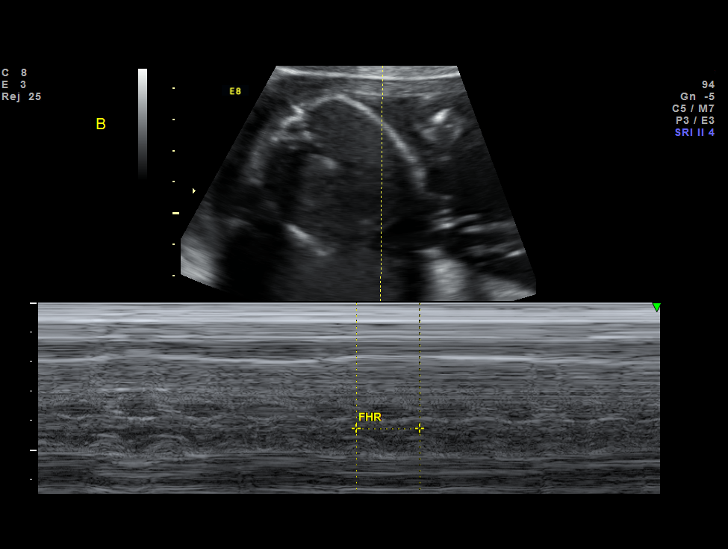
[im 6/22]
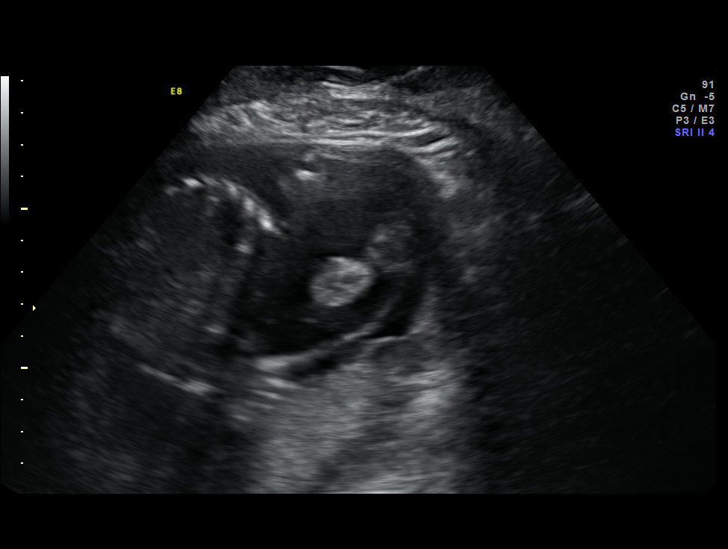
[im 8/22]
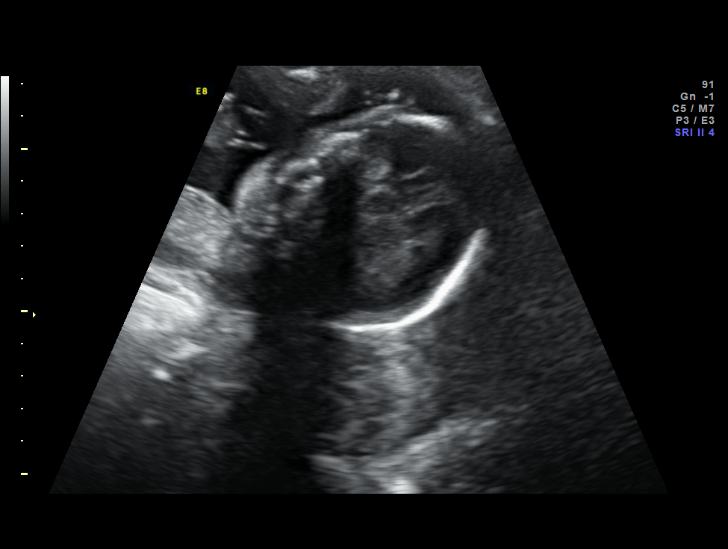
[im 9/22]
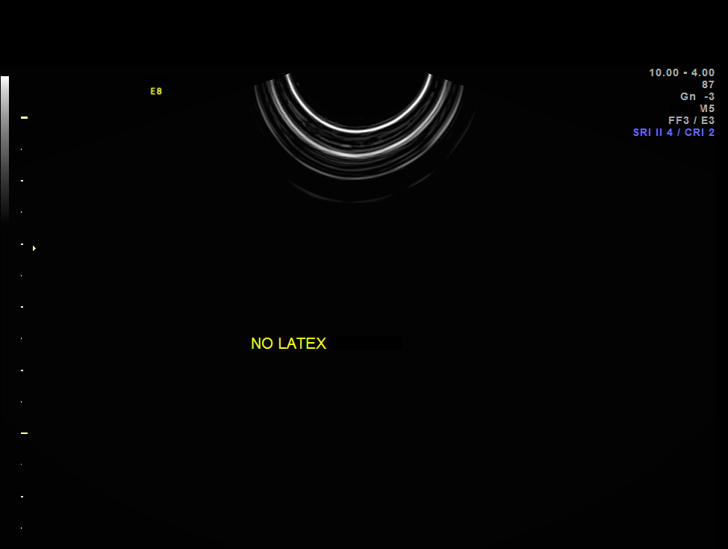
[im 11/22]
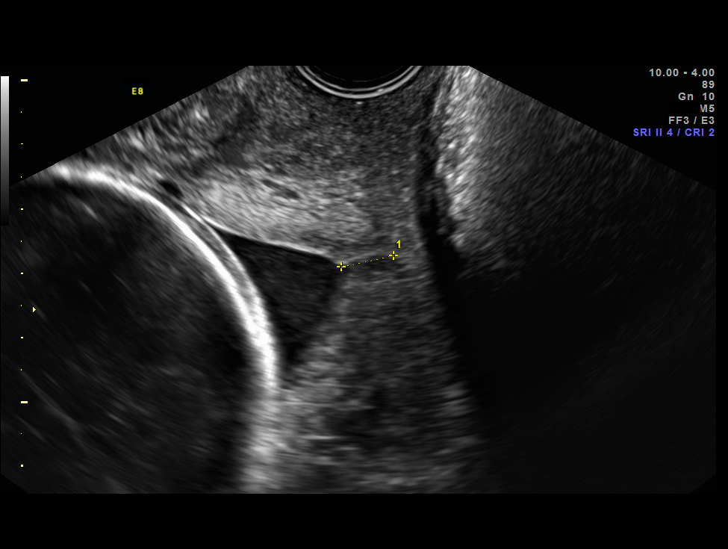
[im 12/22]
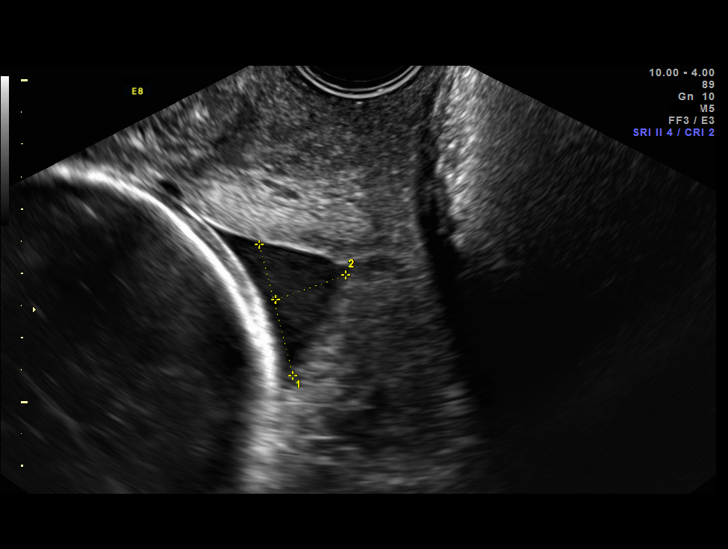
[im 14/22]
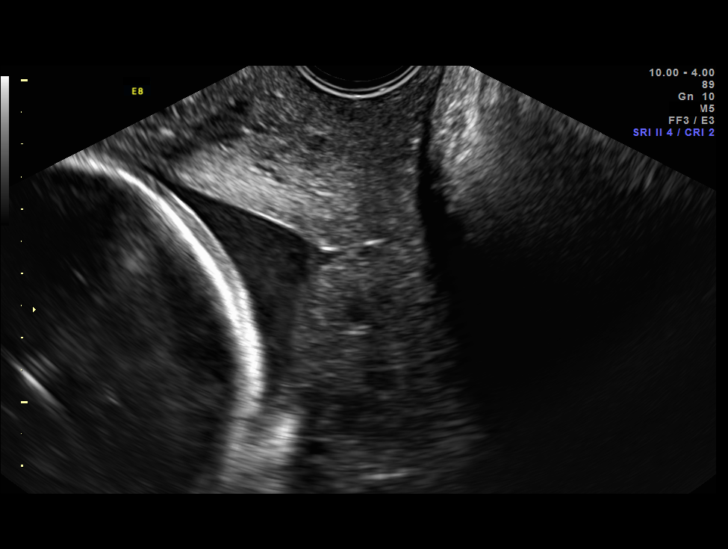
[im 15/22]
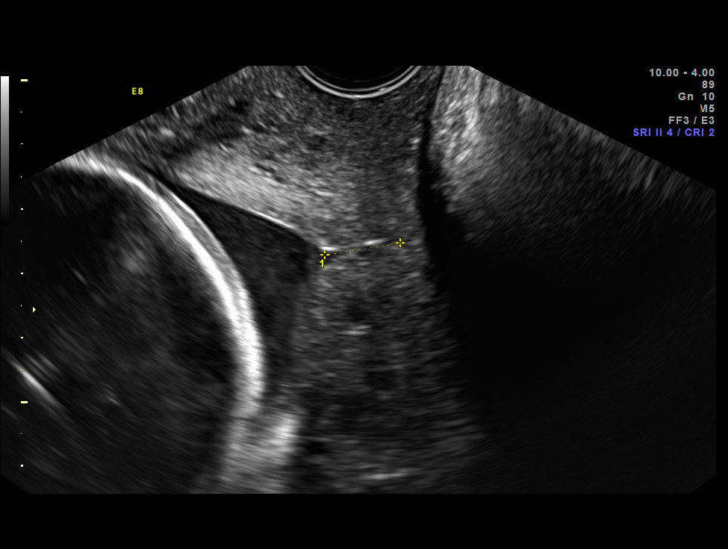
[im 17/22]
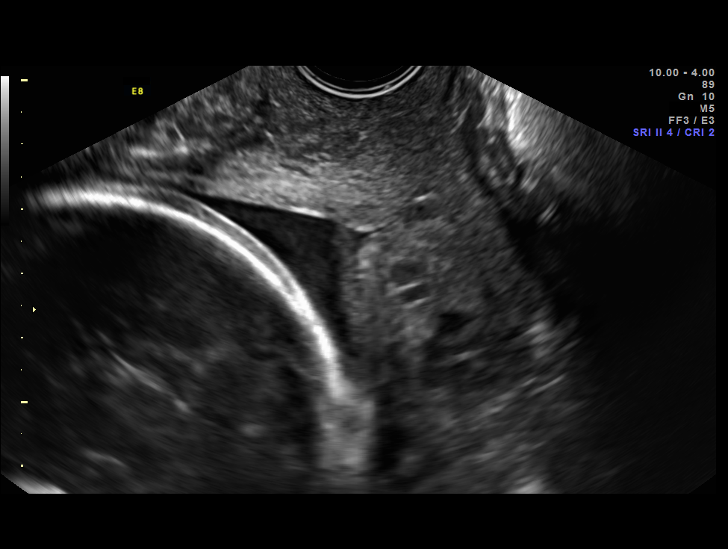
[im 19/22]
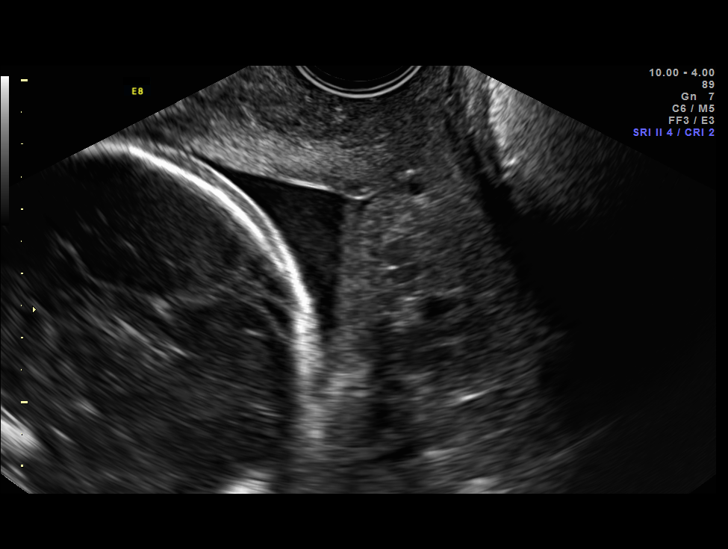
[im 20/22]
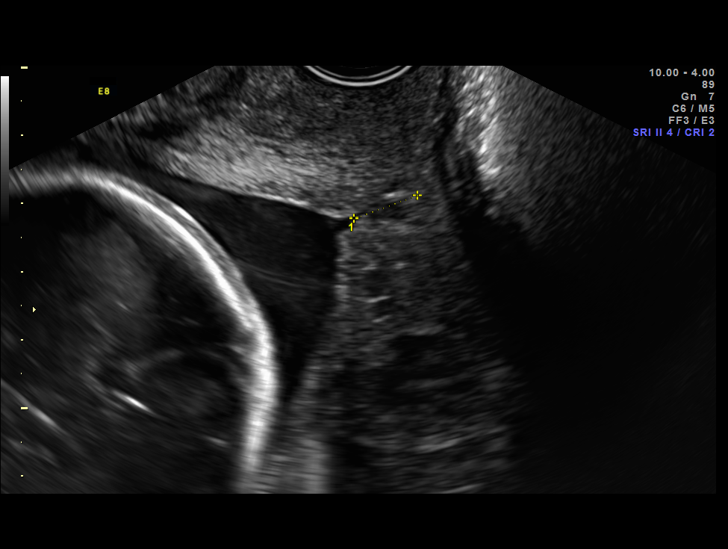
[im 22/22]
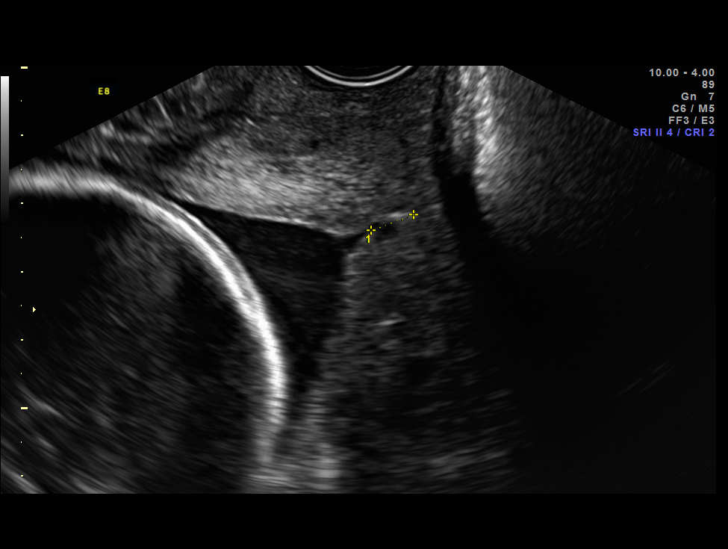

[14 of 22 positions shown; findings below may reference images not displayed]

Canned report from images found in remote index.

Refer to host system for actual result text.

## 2012-08-08 IMAGING — US US OB TRANSVAGINAL
1 series · 14 of 28 positions shown · non-contrast
Comparison: none

[Series 1: us ob transvaginal · 14 of 30 slices shown]
[im 2/30]
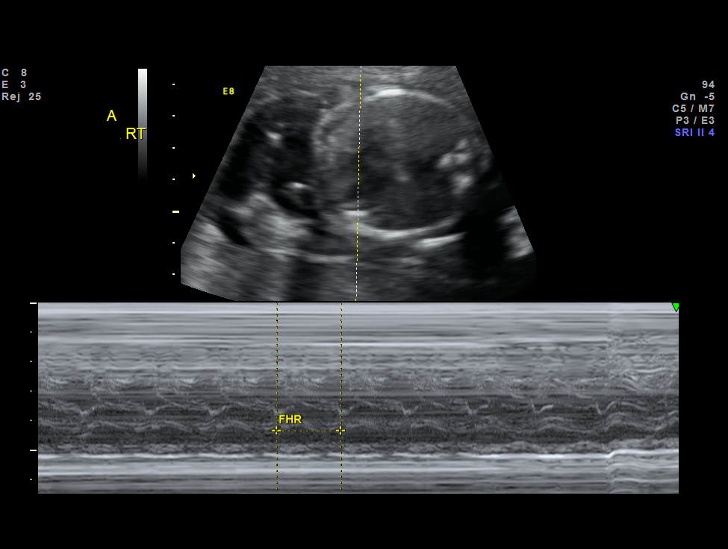
[im 4/30]
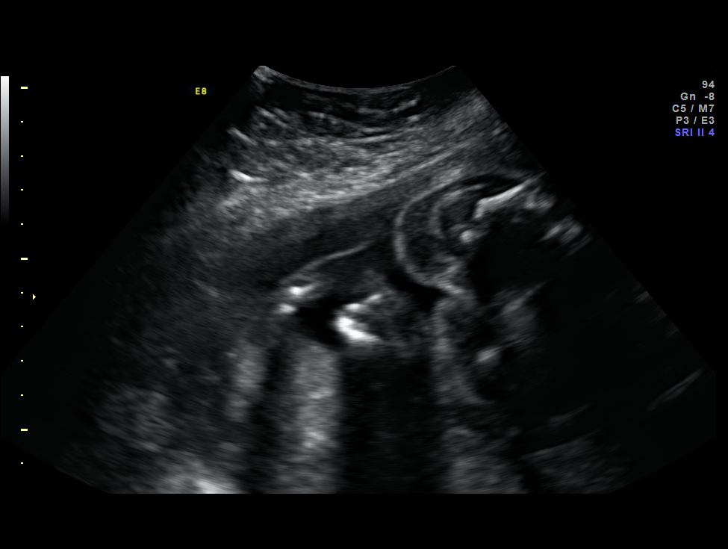
[im 6/30]
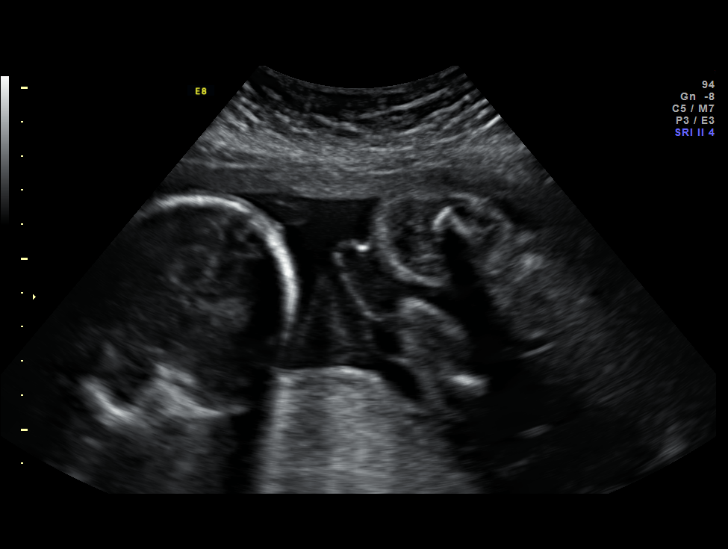
[im 8/30]
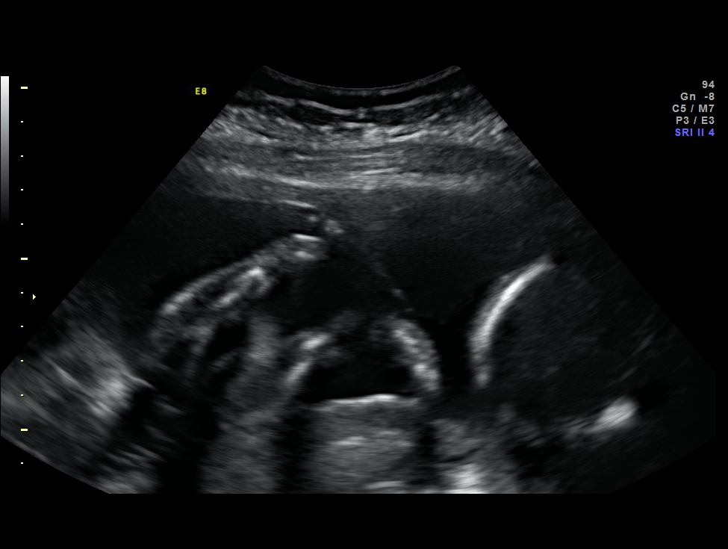
[im 10/30]
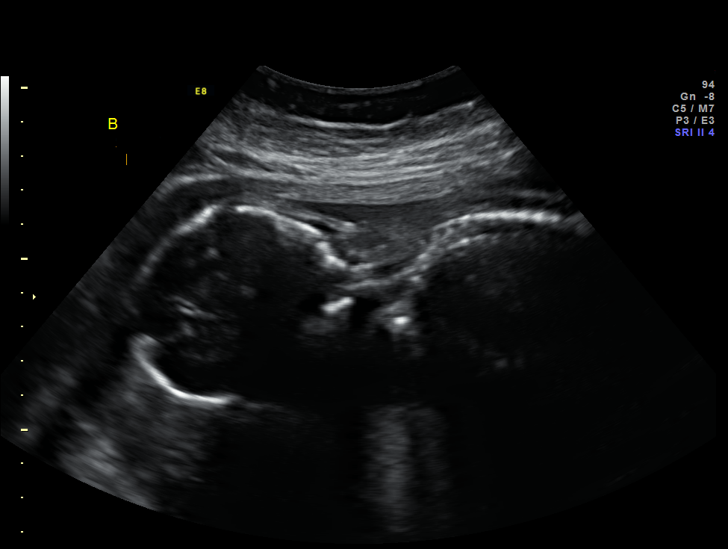
[im 12/30]
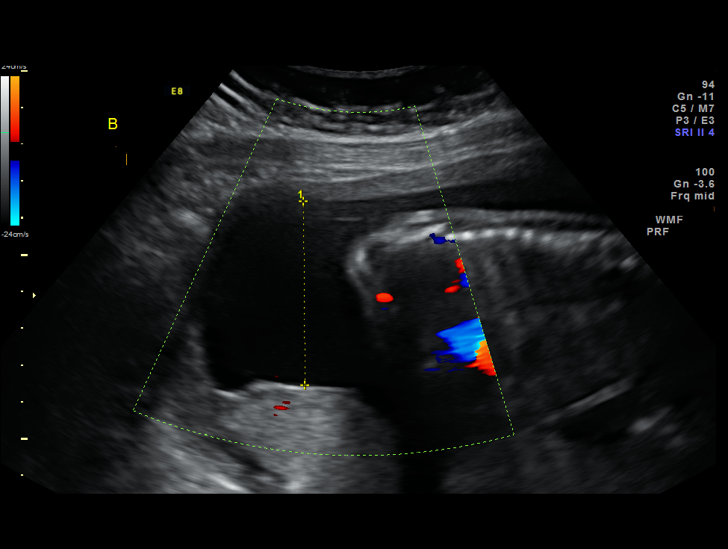
[im 14/30]
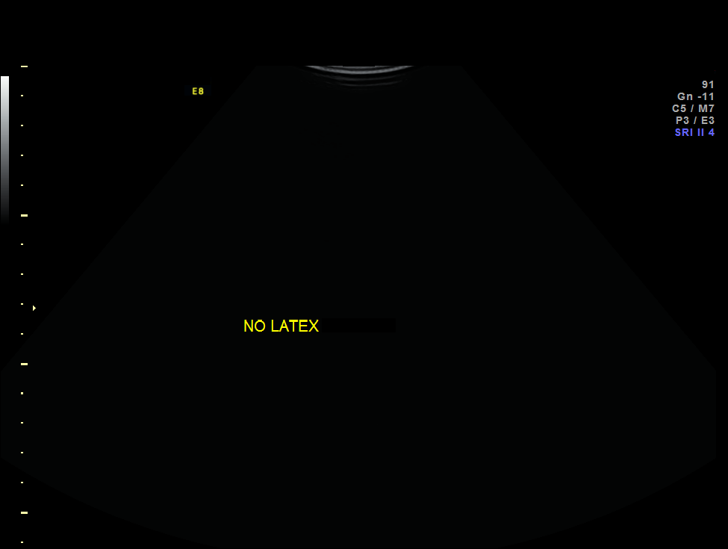
[im 17/30]
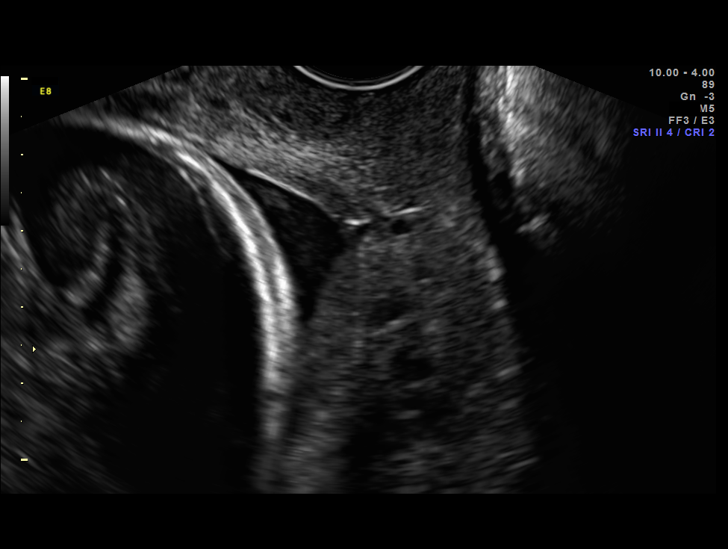
[im 19/30]
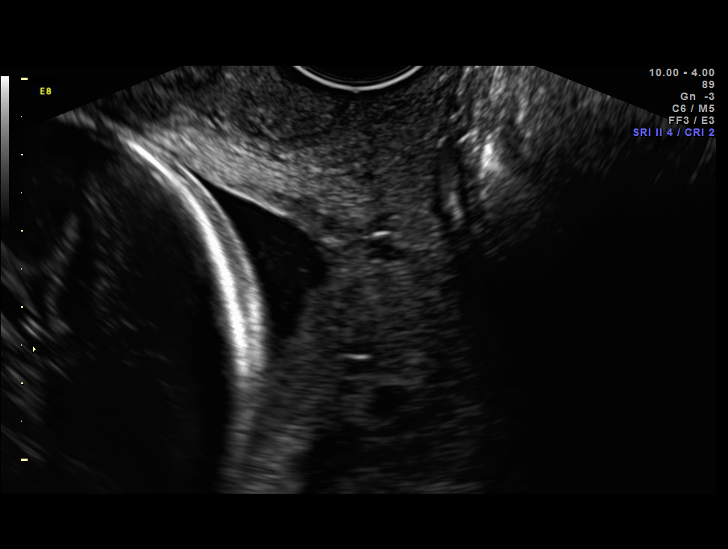
[im 21/30]
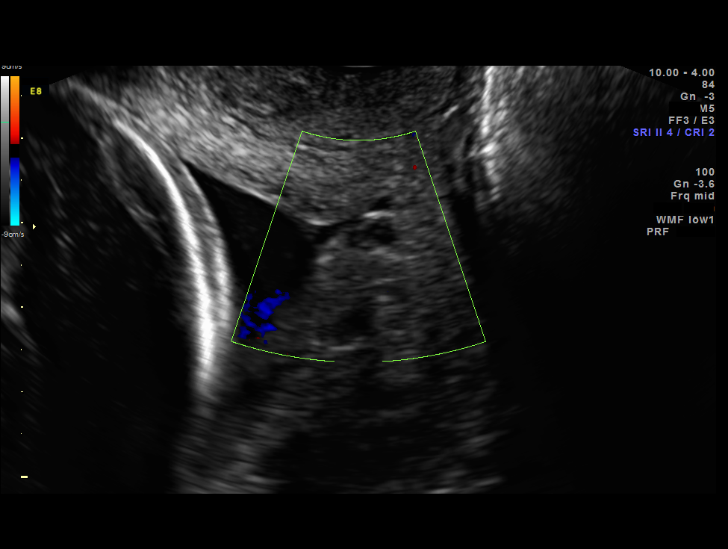
[im 23/30]
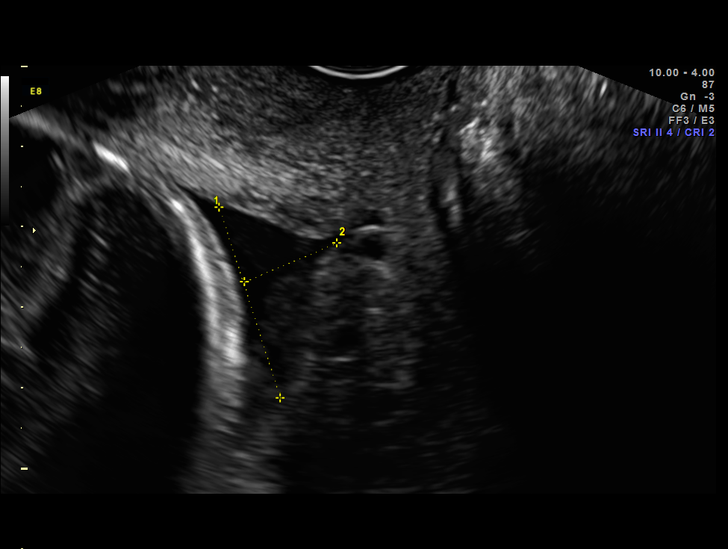
[im 25/30]
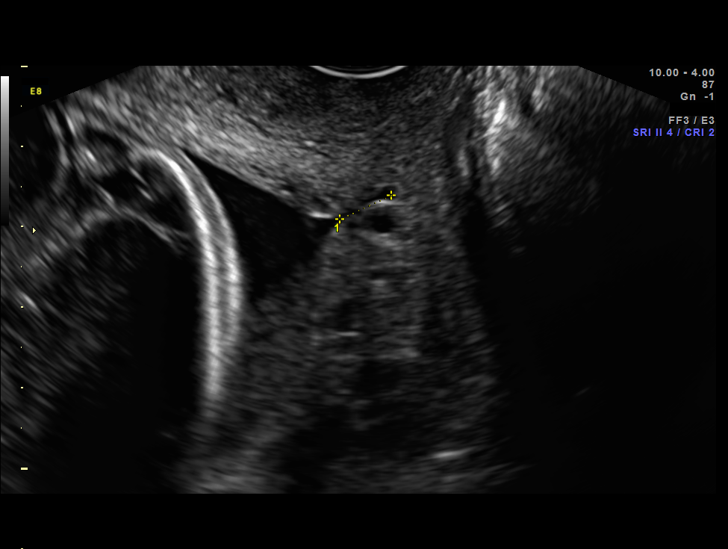
[im 27/30]
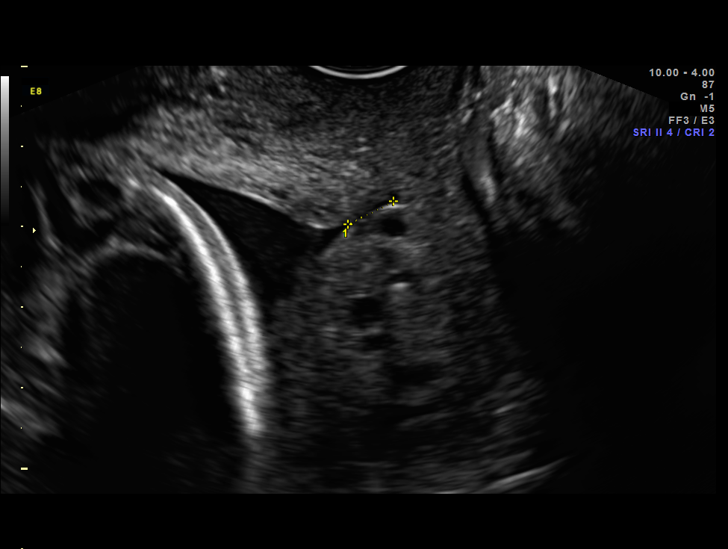
[im 30/30]
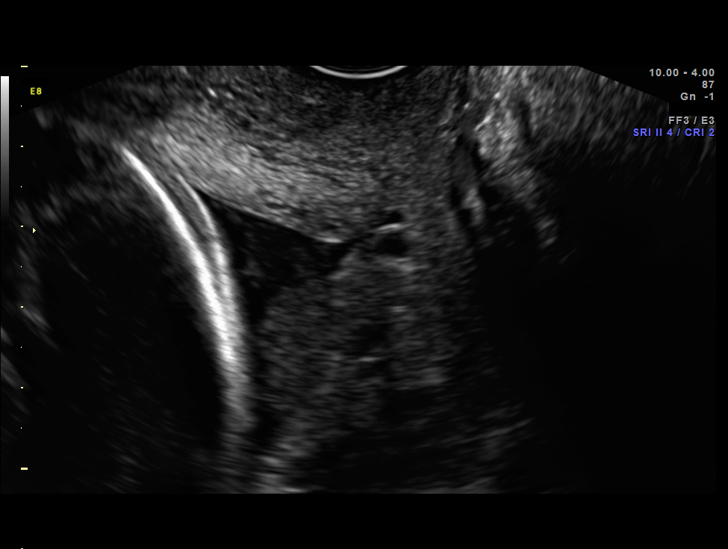

[14 of 28 positions shown; findings below may reference images not displayed]

Canned report from images found in remote index.

Refer to host system for actual result text.

## 2012-08-29 IMAGING — US US OB TRANSVAGINAL
1 series · 14 of 27 positions shown · non-contrast
Comparison: none

[Series 1: us ob transvaginal · 0.26mm/px · 14 of 27 slices shown]
[im 1/27]
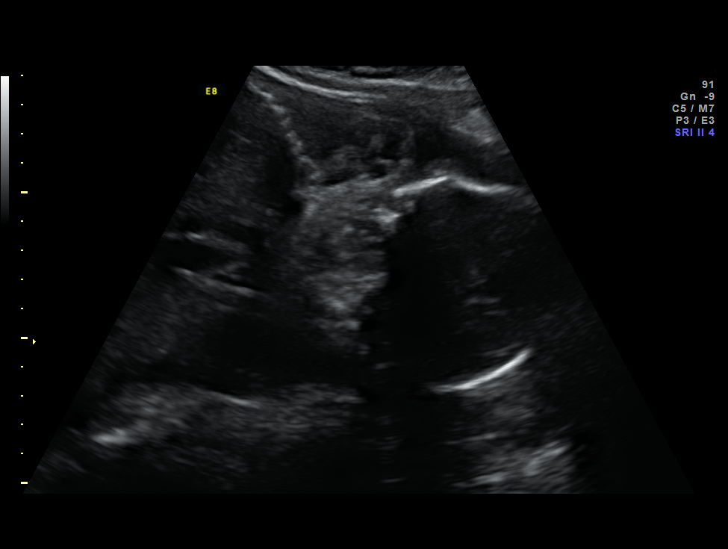
[im 3/27]
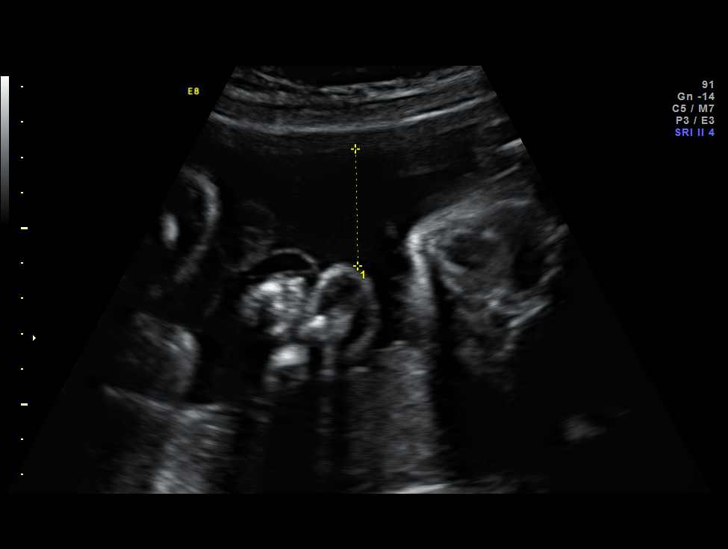
[im 5/27]
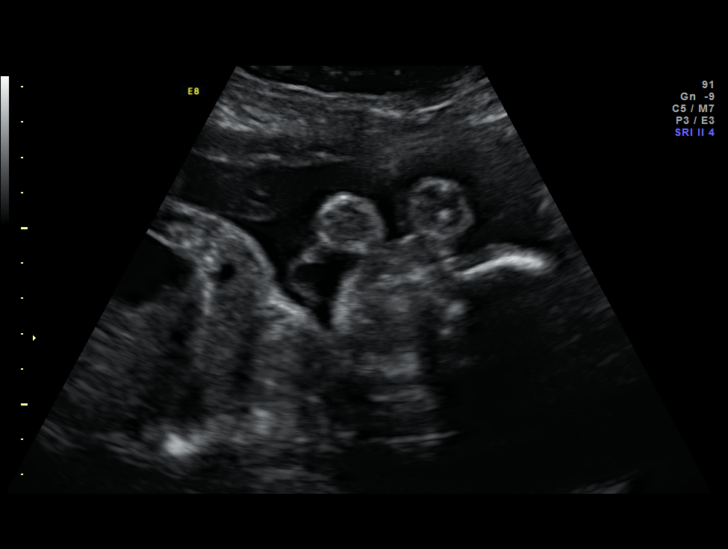
[im 7/27]
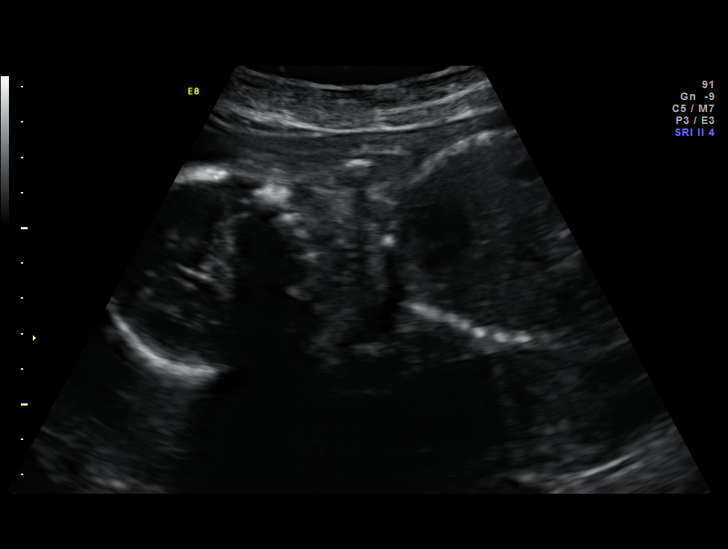
[im 9/27]
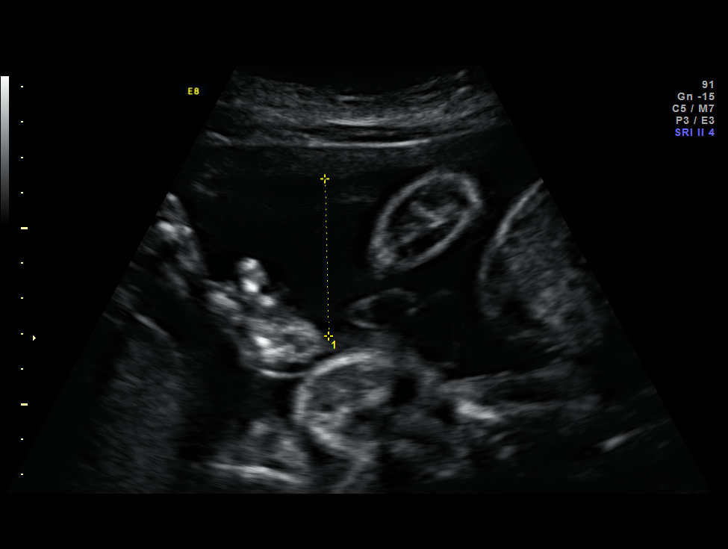
[im 11/27]
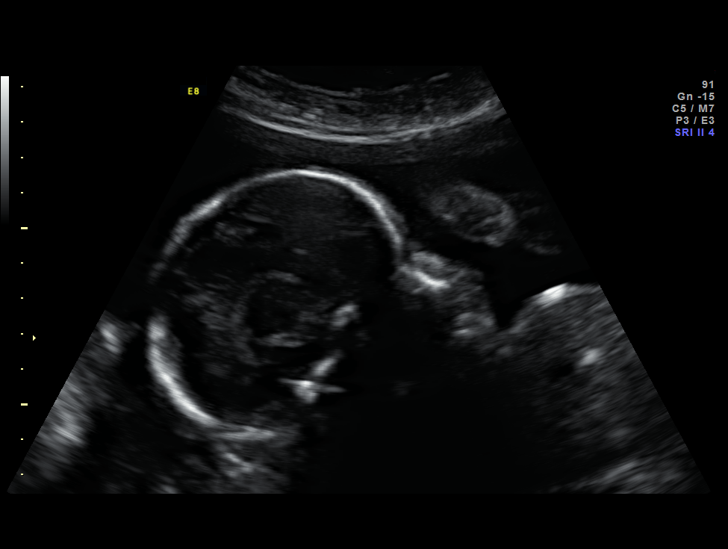
[im 13/27]
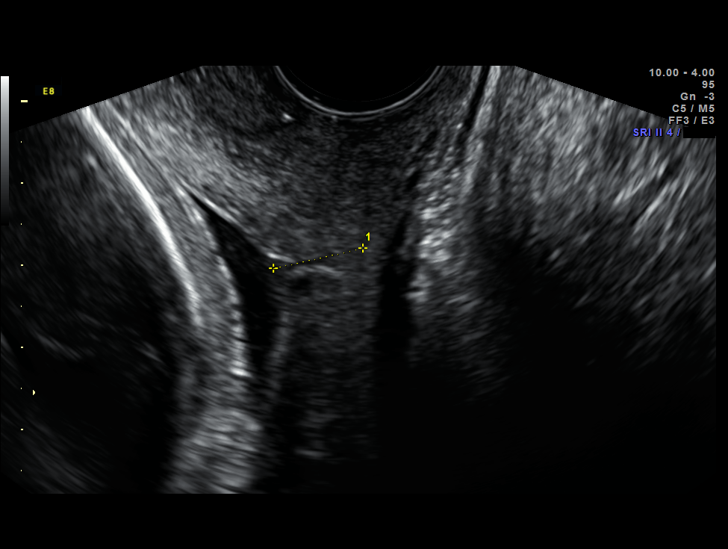
[im 15/27]
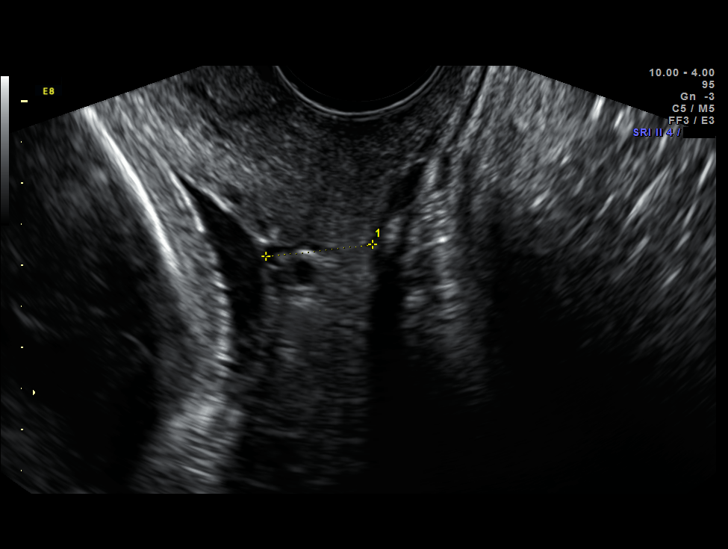
[im 17/27]
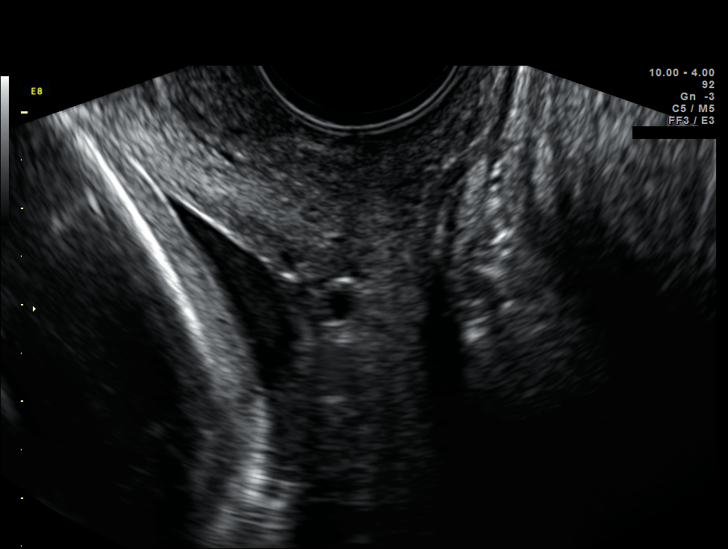
[im 19/27]
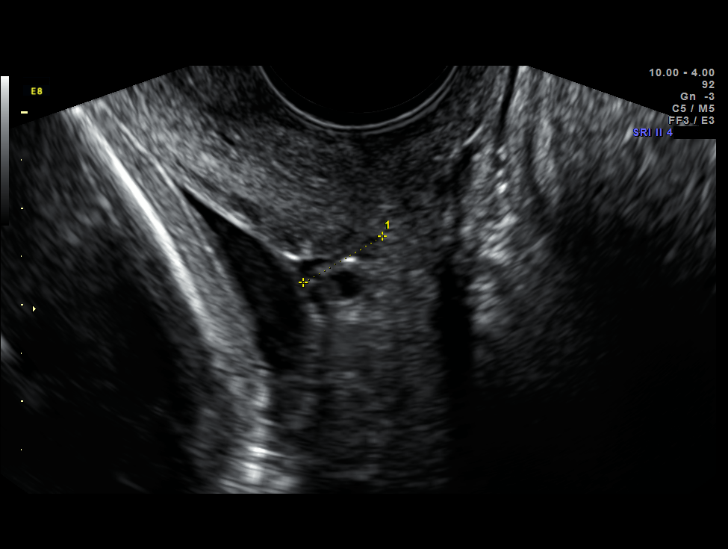
[im 21/27]
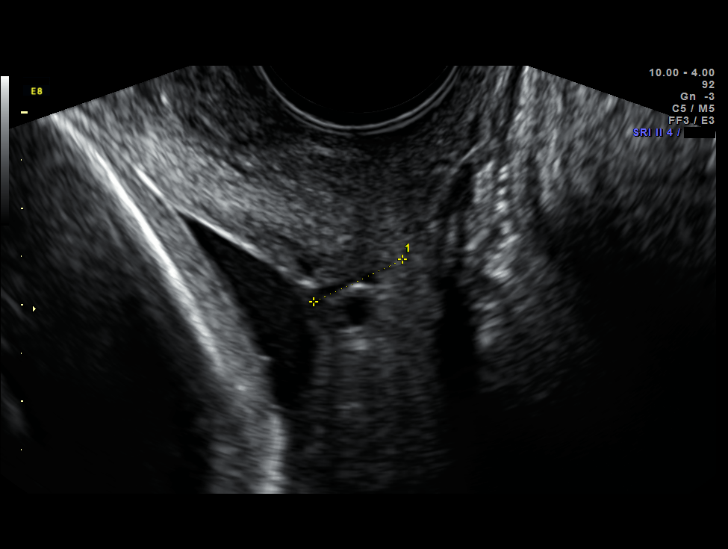
[im 23/27]
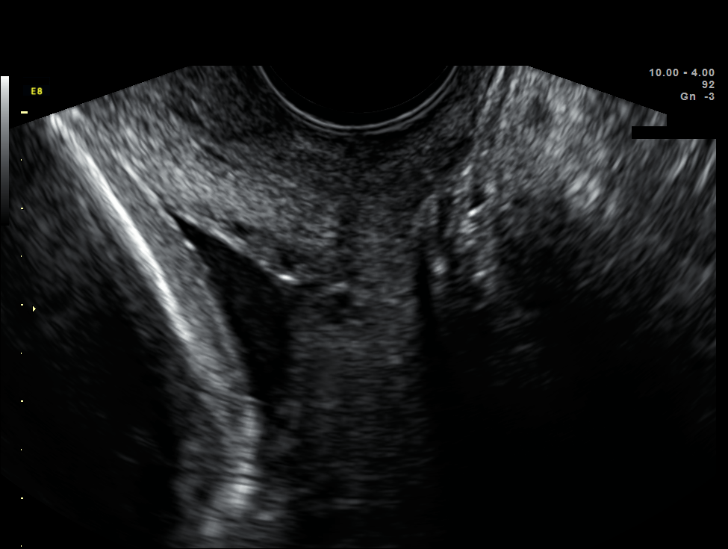
[im 25/27]
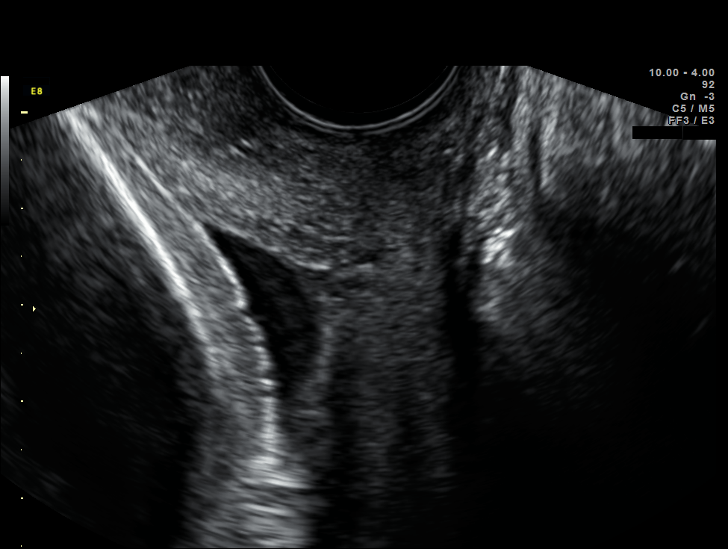
[im 27/27]
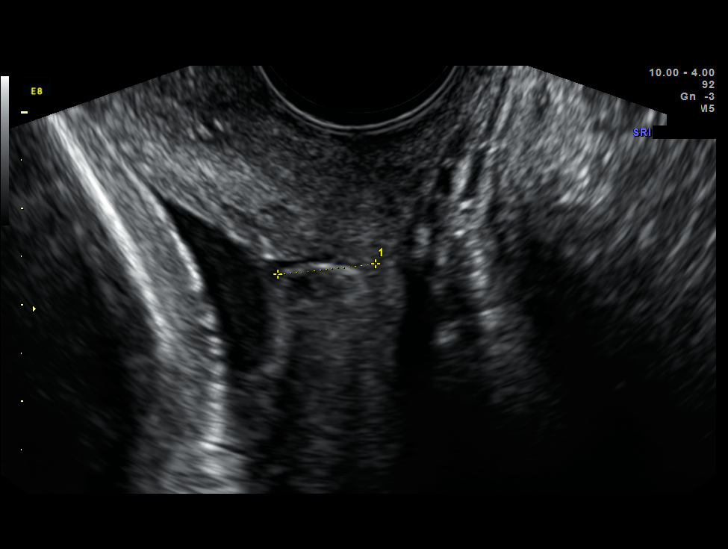

[14 of 27 positions shown; findings below may reference images not displayed]

Canned report from images found in remote index.

Refer to host system for actual result text.

## 2013-10-28 ENCOUNTER — Encounter: Payer: Self-pay | Admitting: Advanced Practice Midwife

## 2013-10-28 ENCOUNTER — Ambulatory Visit (INDEPENDENT_AMBULATORY_CARE_PROVIDER_SITE_OTHER): Payer: Managed Care, Other (non HMO) | Admitting: Advanced Practice Midwife

## 2013-10-28 VITALS — BP 114/74 | HR 86 | Temp 97.9°F | Ht 68.0 in | Wt 240.0 lb

## 2013-10-28 DIAGNOSIS — Z3202 Encounter for pregnancy test, result negative: Secondary | ICD-10-CM

## 2013-10-28 DIAGNOSIS — Z6836 Body mass index (BMI) 36.0-36.9, adult: Secondary | ICD-10-CM

## 2013-10-28 DIAGNOSIS — Z01419 Encounter for gynecological examination (general) (routine) without abnormal findings: Secondary | ICD-10-CM | POA: Insufficient documentation

## 2013-10-28 DIAGNOSIS — L709 Acne, unspecified: Secondary | ICD-10-CM

## 2013-10-28 DIAGNOSIS — Z0000 Encounter for general adult medical examination without abnormal findings: Secondary | ICD-10-CM

## 2013-10-28 LAB — POCT URINE PREGNANCY: Preg Test, Ur: NEGATIVE

## 2013-10-28 LAB — COMPREHENSIVE METABOLIC PANEL
Albumin: 4.3 g/dL (ref 3.5–5.2)
BUN: 9 mg/dL (ref 6–23)
CO2: 30 mEq/L (ref 19–32)
Calcium: 9.6 mg/dL (ref 8.4–10.5)
Chloride: 103 mEq/L (ref 96–112)
Glucose, Bld: 101 mg/dL — ABNORMAL HIGH (ref 70–99)
Potassium: 3.7 mEq/L (ref 3.5–5.3)

## 2013-10-28 LAB — POCT URINALYSIS DIPSTICK
Ketones, UA: NEGATIVE
Spec Grav, UA: 1.025
pH, UA: 5

## 2013-10-28 LAB — TRIGLYCERIDES: Triglycerides: 145 mg/dL (ref <150)

## 2013-10-28 LAB — CHOLESTEROL, TOTAL: Cholesterol: 179 mg/dL (ref 0–200)

## 2013-10-28 LAB — CBC WITH DIFFERENTIAL/PLATELET
Basophils Relative: 0 % (ref 0–1)
HCT: 39.4 % (ref 36.0–46.0)
Hemoglobin: 13.3 g/dL (ref 12.0–15.0)
Lymphocytes Relative: 32 % (ref 12–46)
Lymphs Abs: 3.2 10*3/uL (ref 0.7–4.0)
MCHC: 33.8 g/dL (ref 30.0–36.0)
Monocytes Absolute: 0.7 10*3/uL (ref 0.1–1.0)
Monocytes Relative: 7 % (ref 3–12)
Neutro Abs: 5.7 10*3/uL (ref 1.7–7.7)

## 2013-10-28 MED ORDER — CLINDAMYCIN PHOSPHATE 1 % EX GEL: Freq: Two times a day (BID) | CUTANEOUS | Status: DC

## 2013-10-28 NOTE — Progress Notes (Deleted)
Subjective:     Kaitlyn Kennedy is a 32 y.o. female here for a routine exam.  Current complaints: pt states may have a UTI, having urgency, and stimulation.  Personal health questionnaire reviewed: yes.   Gynecologic History Patient's last menstrual period was 10/17/2013. Contraception: none Last Pap: 2012. Results were: normal Last mammogram: N/A  Obstetric History OB History  Gravida Para Term Preterm AB SAB TAB Ectopic Multiple Living  1 1 0 1 0 0 0 0 1 2     # Outcome Date GA Lbr Len/2nd Weight Sex Delivery Anes PTL Lv  1A PRE 11/30/11 [redacted]w[redacted]d 07:05 / 00:32 3 lb 15 oz (1.786 kg) F SVD EPI  Y     Comments: no dysmorphic features, voided at delivery; team not notified until after delivery of twin A  1B PRE 11/30/11 [redacted]w[redacted]d 07:05 / 00:39 3 lb 9.3 oz (1.624 kg) F SVD EPI  Y       {Common ambulatory SmartLinks:19316}  Review of Systems {ros; complete:30496}    Objective:    {exam; complete:18323}    Assessment:    Healthy female exam.    Plan:    {plan:19193}

## 2013-10-28 NOTE — Progress Notes (Signed)
Patient ID: Kaitlyn Kennedy, female   DOB: [birth year], 32 y.o.   MRN: [id number]  Chief Complaint  Patient presents with  . Gynecologic Exam    HPI Kaitlyn Kennedy is a 32 y.o. female.  Here for annual exam.  Gynecologic Exam Associated symptoms include dysuria, frequency and urgency.   Current complaints: pt states may have a UTI, having urgency, and stimulation.  Patient desires annual well visit labs.   Patient desires treatment for skin acne underneath bilateral breast.  Patient going back to work shortly after staying home with her children the past 2 years.   Past Medical History  Diagnosis Date  . No pertinent past medical history     Past Surgical History  Procedure Laterality Date  . Inguinal hernia repair  1985  . Vaginal delivery  11/30/2011    Procedure: VAGINAL DELIVERY;  Surgeon: Brock Bad, MD;  Location: [facility];  Service: Gynecology;;    Family History  Problem Relation Age of Onset  . Hypertension Mother   . Hyperlipidemia Mother   . Cancer Maternal Grandmother   . Cancer Maternal Grandfather   . Cancer Paternal Grandmother   . Asthma Paternal Grandfather   . Emphysema Paternal Grandfather     Social History History  Substance Use Topics  . Smoking status: Former Games developer  . Smokeless tobacco: Not on file  . Alcohol Use: No    No Known Allergies  Current Outpatient Prescriptions  Medication Sig Dispense Refill  . clindamycin (CLINDAGEL) 1 % gel Apply topically 2 (two) times daily.  30 g  0  . zolpidem (AMBIEN) 5 MG tablet Take 1 tablet (5 mg total) by mouth at bedtime as needed for sleep (insomnia. May repeat  Ambien 5 mg po prn insomnia x 1.).  15 tablet  0   No current facility-administered medications for this visit.    Review of Systems Review of Systems  Constitutional: Negative.   HENT: Negative.   Eyes: Negative.   Respiratory: Negative.   Cardiovascular: Negative.   Gastrointestinal: Negative.   Endocrine: Negative.    Genitourinary: Positive for dysuria, urgency and frequency.       Patient had these symptoms over a week ago and they recently resolved.  Musculoskeletal: Negative.   Allergic/Immunologic: Negative.   Neurological: Negative.   Hematological: Negative.   Psychiatric/Behavioral: Negative.     Blood pressure 114/74, pulse 86, temperature 97.9 F (36.6 C), height 5\' 8"  (1.727 m), weight 240 lb (108.863 kg), last menstrual period 10/17/2013, not currently breastfeeding.  Physical Exam Physical Exam Physical Examination: General appearance - alert, well appearing, and in no distress Mental status - alert, oriented to person, place, and time Nose - normal and patent, no erythema, discharge or polyps Mouth - mucous membranes moist, pharynx normal without lesions Neck - supple, no significant adenopathy Lymphatics - no palpable lymphadenopathy, no hepatosplenomegaly Chest - clear to auscultation, no wheezes, rales or rhonchi, symmetric air entry Heart - normal rate, regular rhythm, normal S1, S2, no murmurs, rubs, clicks or gallops Abdomen - soft, nontender, nondistended, no masses or organomegaly Breasts - breasts appear normal, no suspicious masses, no skin or nipple changes or axillary nodes Pelvic - normal external genitalia, vulva, vagina, cervix, uterus and adnexa Rectal - normal rectal, no masses Back exam - full range of motion, no tenderness, palpable spasm or pain on motion Neurological - alert, oriented, normal speech, no focal findings or movement disorder noted Musculoskeletal - no joint tenderness, deformity or swelling Extremities - peripheral  pulses normal, no pedal edema, no clubbing or cyanosis Skin - normal coloration and turgor, no rashes, no suspicious skin lesions noted   Data Reviewed NA  Assessment  Well woman exam w/ pap smear STI screen  Recent UTI symptoms have resolved Body Acne    Plan    Labs done today. Pap Smear w/ HPV Urine Culture  pending Clindamycin BID topical application Patient to RTC PRN       Berlin Viereck 10/28/2013, 10:28 PM

## 2013-10-29 LAB — TSH: TSH: 0.875 u[IU]/mL (ref 0.350–4.500)

## 2013-10-29 LAB — PAP IG, CT-NG, RFX HPV ASCU

## 2013-10-29 LAB — RPR

## 2013-10-29 LAB — GC/CHLAMYDIA PROBE AMP: GC Probe RNA: NEGATIVE

## 2013-12-31 ENCOUNTER — Encounter: Payer: Self-pay | Admitting: Family Medicine

## 2013-12-31 ENCOUNTER — Ambulatory Visit (INDEPENDENT_AMBULATORY_CARE_PROVIDER_SITE_OTHER): Payer: Managed Care, Other (non HMO) | Admitting: Family Medicine

## 2013-12-31 VITALS — BP 114/76 | HR 91 | Temp 98.3°F | Ht 67.0 in | Wt 246.0 lb

## 2013-12-31 DIAGNOSIS — Z6836 Body mass index (BMI) 36.0-36.9, adult: Secondary | ICD-10-CM

## 2013-12-31 DIAGNOSIS — L709 Acne, unspecified: Secondary | ICD-10-CM

## 2013-12-31 MED ORDER — PHENTERMINE HCL 15 MG PO CAPS: 15.0000 mg | ORAL_CAPSULE | ORAL | Status: DC

## 2013-12-31 MED ORDER — CLINDAMYCIN PHOSPHATE 1 % EX GEL: Freq: Two times a day (BID) | CUTANEOUS | Status: DC

## 2013-12-31 NOTE — Assessment & Plan Note (Signed)
>  25 min spent with face to face with patient, >50% counseling and/or coordinating care  Discussed risks and benefits of phentermine.  Rx for phentermine 15 mg printed out and handed to patient. Follow up BP in one month.

## 2013-12-31 NOTE — Progress Notes (Signed)
   Subjective:    Patient ID: Kaitlyn Kennedy, female    DOB: 10-19-1981, 33 y.o.   MRN: [id number]  HPI  Very pleasant 33 yo female establishing care and would like to discuss weight loss options.  Has gained 100 pounds since she got married.  Did have twins two years ago. Admits she needs to eat better and become more active but needs a little help as well.  No h/o HTN.  Does not desire further pregnancy.  Patient Active Problem List   Diagnosis Date Noted  . BMI 36.0-36.9,adult 10/28/2013  . Body Acne 10/28/2013  . Routine gynecological examination 10/28/2013   Past Medical History  Diagnosis Date  . No pertinent past medical history    Past Surgical History  Procedure Laterality Date  . Inguinal hernia repair  1985  . Vaginal delivery  11/30/2011    Procedure: VAGINAL DELIVERY;  Surgeon: Brock Badharles A Harper, MD;  Location: [facility];  Service: Gynecology;;  . Hernia repair  1987   History  Substance Use Topics  . Smoking status: Former Games developermoker  . Smokeless tobacco: Not on file  . Alcohol Use: No   Family History  Problem Relation Age of Onset  . Hypertension Mother   . Hyperlipidemia Mother   . Cancer Maternal Grandmother   . Cancer Maternal Grandfather   . Cancer Paternal Grandmother   . Asthma Paternal Grandfather   . Emphysema Paternal Grandfather    No Known Allergies Current Outpatient Prescriptions on File Prior to Visit  Medication Sig Dispense Refill  . zolpidem (AMBIEN) 5 MG tablet Take 1 tablet (5 mg total) by mouth at bedtime as needed for sleep (insomnia. May repeat  Ambien 5 mg po prn insomnia x 1.).  15 tablet  0   No current facility-administered medications on file prior to visit.   The PMH, PSH, Social History, Family History, Medications, and allergies [facility], and have been updated if relevant.    Review of Systems    See HPI No CP No palpitations No SOB No anxiety or depression Objective:   Physical Exam BP 114/76   Pulse 91  Temp(Src) 98.3 F (36.8 C) (Oral)  Ht 5\' 7"  (1.702 m)  Wt 246 lb (111.585 kg)  BMI 38.52 kg/m2  SpO2 98%  LMP 12/13/2013 Gen:  Alert, pleasant, NAD CVS:  RRR Resp:  CTA bilaterally Ext:  No edema     Assessment & Plan:

## 2014-01-05 ENCOUNTER — Other Ambulatory Visit: Payer: Self-pay | Admitting: Family Medicine

## 2014-01-05 MED ORDER — ONDANSETRON HCL 4 MG PO TABS: 4.0000 mg | ORAL_TABLET | Freq: Three times a day (TID) | ORAL | Status: DC | PRN

## 2014-01-29 ENCOUNTER — Other Ambulatory Visit: Payer: Self-pay | Admitting: Family Medicine

## 2014-01-29 MED ORDER — PHENTERMINE HCL 30 MG PO CAPS: 30.0000 mg | ORAL_CAPSULE | ORAL | Status: DC

## 2014-02-25 ENCOUNTER — Other Ambulatory Visit: Payer: Self-pay | Admitting: Family Medicine

## 2014-02-25 MED ORDER — PHENTERMINE HCL 30 MG PO CAPS: 30.0000 mg | ORAL_CAPSULE | ORAL | Status: DC

## 2014-02-27 ENCOUNTER — Telehealth: Payer: Self-pay | Admitting: *Deleted

## 2014-02-27 NOTE — Telephone Encounter (Signed)
Kaitlyn Kennedy's BP was 125/76 today

## 2014-03-23 ENCOUNTER — Other Ambulatory Visit: Payer: Self-pay | Admitting: Family Medicine

## 2014-03-23 MED ORDER — CETIRIZINE HCL 10 MG PO TABS: 10.0000 mg | ORAL_TABLET | Freq: Every day | ORAL | Status: DC

## 2014-03-24 ENCOUNTER — Other Ambulatory Visit: Payer: Self-pay | Admitting: Internal Medicine

## 2014-03-24 MED ORDER — FLUTICASONE PROPIONATE 50 MCG/ACT NA SUSP: 2.0000 | Freq: Every day | NASAL | Status: DC

## 2014-04-09 ENCOUNTER — Ambulatory Visit: Payer: Managed Care, Other (non HMO) | Admitting: Family Medicine

## 2014-04-13 ENCOUNTER — Ambulatory Visit: Payer: Managed Care, Other (non HMO) | Admitting: Family Medicine

## 2014-04-17 ENCOUNTER — Other Ambulatory Visit: Payer: Self-pay | Admitting: Internal Medicine

## 2014-04-20 ENCOUNTER — Telehealth: Payer: Self-pay | Admitting: Family Medicine

## 2014-04-20 MED ORDER — ALBUTEROL SULFATE HFA 108 (90 BASE) MCG/ACT IN AERS: 2.0000 | INHALATION_SPRAY | Freq: Four times a day (QID) | RESPIRATORY_TRACT | Status: DC | PRN

## 2014-04-20 MED ORDER — AMOXICILLIN 875 MG PO TABS: 875.0000 mg | ORAL_TABLET | Freq: Two times a day (BID) | ORAL | Status: DC

## 2014-04-20 MED ORDER — HYDROCOD POLST-CHLORPHEN POLST 10-8 MG/5ML PO LQCR: 5.0000 mL | Freq: Every evening | ORAL | Status: DC | PRN

## 2014-04-20 NOTE — Telephone Encounter (Signed)
Patient's daughters both being treated for pneumonia and now she has very wet cough, no fever but she has not checked it.  Will give rx for cough medicine with codeine, albuterol inhaler and abx.

## 2014-04-24 ENCOUNTER — Other Ambulatory Visit: Payer: Self-pay | Admitting: *Deleted

## 2014-04-24 NOTE — Telephone Encounter (Signed)
BP 124/82 Pulse 83.

## 2014-04-26 MED ORDER — PHENTERMINE HCL 30 MG PO CAPS: 30.0000 mg | ORAL_CAPSULE | ORAL | Status: DC

## 2014-04-27 MED ORDER — PHENTERMINE HCL 30 MG PO CAPS: 30.0000 mg | ORAL_CAPSULE | ORAL | Status: DC

## 2014-04-27 NOTE — Addendum Note (Signed)
Addended by: Damita LackLORING, DONNA S on: 04/27/2014 08:47 AM   Modules accepted: Orders

## 2014-05-05 ENCOUNTER — Other Ambulatory Visit: Payer: Self-pay | Admitting: *Deleted

## 2014-05-05 MED ORDER — HYDROCOD POLST-CHLORPHEN POLST 10-8 MG/5ML PO LQCR: 5.0000 mL | Freq: Every evening | ORAL | Status: DC | PRN

## 2014-05-05 NOTE — Telephone Encounter (Signed)
Pt states cough is not better and would like a refill, please advise

## 2014-05-27 ENCOUNTER — Other Ambulatory Visit: Payer: Self-pay | Admitting: *Deleted

## 2014-05-27 MED ORDER — PHENTERMINE HCL 37.5 MG PO CAPS: 37.5000 mg | ORAL_CAPSULE | ORAL | Status: DC

## 2014-05-27 NOTE — Telephone Encounter (Signed)
plz phone in. 

## 2014-05-27 NOTE — Telephone Encounter (Signed)
rx called into pharmacy

## 2014-07-27 ENCOUNTER — Other Ambulatory Visit: Payer: Self-pay | Admitting: *Deleted

## 2014-07-27 MED ORDER — PHENTERMINE HCL 37.5 MG PO CAPS: 37.5000 mg | ORAL_CAPSULE | ORAL | Status: DC

## 2014-07-27 NOTE — Telephone Encounter (Signed)
Please advise bp 118/68

## 2014-09-07 ENCOUNTER — Other Ambulatory Visit: Payer: Self-pay | Admitting: Family Medicine

## 2014-09-07 MED ORDER — CETIRIZINE HCL 10 MG PO TABS: 10.0000 mg | ORAL_TABLET | Freq: Every day | ORAL | Status: DC

## 2014-10-12 ENCOUNTER — Encounter: Payer: Self-pay | Admitting: Family Medicine

## 2014-11-12 ENCOUNTER — Other Ambulatory Visit: Payer: Self-pay | Admitting: *Deleted

## 2014-11-12 MED ORDER — PHENTERMINE HCL 37.5 MG PO CAPS: 37.5000 mg | ORAL_CAPSULE | ORAL | Status: DC

## 2014-11-12 NOTE — Telephone Encounter (Signed)
RX printed and signed and given to pt

## 2014-11-12 NOTE — Telephone Encounter (Signed)
rx called into pharmacy

## 2015-01-11 ENCOUNTER — Ambulatory Visit: Payer: Self-pay | Admitting: Family Medicine

## 2015-01-11 ENCOUNTER — Ambulatory Visit (INDEPENDENT_AMBULATORY_CARE_PROVIDER_SITE_OTHER): Payer: Managed Care, Other (non HMO) | Admitting: Family Medicine

## 2015-01-11 ENCOUNTER — Encounter: Payer: Self-pay | Admitting: Family Medicine

## 2015-01-11 ENCOUNTER — Ambulatory Visit (INDEPENDENT_AMBULATORY_CARE_PROVIDER_SITE_OTHER)
Admission: RE | Admit: 2015-01-11 | Discharge: 2015-01-11 | Disposition: A | Discharge status: Home / Self Care | Payer: Self-pay | Source: Ambulatory Visit | Attending: Family Medicine | Admitting: Family Medicine

## 2015-01-11 ENCOUNTER — Ambulatory Visit (INDEPENDENT_AMBULATORY_CARE_PROVIDER_SITE_OTHER)
Admission: RE | Admit: 2015-01-11 | Discharge: 2015-01-11 | Disposition: A | Discharge status: Home / Self Care | Payer: Managed Care, Other (non HMO) | Source: Ambulatory Visit | Attending: Family Medicine | Admitting: Family Medicine

## 2015-01-11 VITALS — BP 124/68 | HR 98 | Temp 98.6°F | Ht 67.0 in | Wt 222.5 lb

## 2015-01-11 DIAGNOSIS — M545 Low back pain, unspecified: Secondary | ICD-10-CM | POA: Insufficient documentation

## 2015-01-11 DIAGNOSIS — Z0000 Encounter for general adult medical examination without abnormal findings: Secondary | ICD-10-CM

## 2015-01-11 DIAGNOSIS — M259 Joint disorder, unspecified: Secondary | ICD-10-CM | POA: Insufficient documentation

## 2015-01-11 DIAGNOSIS — Z01419 Encounter for gynecological examination (general) (routine) without abnormal findings: Secondary | ICD-10-CM

## 2015-01-11 LAB — CBC WITH DIFFERENTIAL/PLATELET
BASOS ABS: 0.1 10*3/uL (ref 0.0–0.1)
Basophils Relative: 0.6 % (ref 0.0–3.0)
Eosinophils Absolute: 0.5 10*3/uL (ref 0.0–0.7)
Eosinophils Relative: 4.8 % (ref 0.0–5.0)
HEMATOCRIT: 38 % (ref 36.0–46.0)
Hemoglobin: 12.7 g/dL (ref 12.0–15.0)
LYMPHS ABS: 3.4 10*3/uL (ref 0.7–4.0)
Lymphocytes Relative: 35.2 % (ref 12.0–46.0)
MCHC: 33.5 g/dL (ref 30.0–36.0)
MCV: 87.8 fl (ref 78.0–100.0)
MONO ABS: 0.7 10*3/uL (ref 0.1–1.0)
MONOS PCT: 7.6 % (ref 3.0–12.0)
Neutro Abs: 4.9 10*3/uL (ref 1.4–7.7)
Neutrophils Relative %: 51.8 % (ref 43.0–77.0)
PLATELETS: 277 10*3/uL (ref 150.0–400.0)
RBC: 4.33 Mil/uL (ref 3.87–5.11)
RDW: 13.5 % (ref 11.5–15.5)
WBC: 9.5 10*3/uL (ref 4.0–10.5)

## 2015-01-11 LAB — COMPREHENSIVE METABOLIC PANEL
ALK PHOS: 58 U/L (ref 39–117)
ALT: 16 U/L (ref 0–35)
AST: 16 U/L (ref 0–37)
Albumin: 4.2 g/dL (ref 3.5–5.2)
BUN: 14 mg/dL (ref 6–23)
CALCIUM: 9.3 mg/dL (ref 8.4–10.5)
CO2: 30 mEq/L (ref 19–32)
CREATININE: 0.75 mg/dL (ref 0.40–1.20)
Chloride: 102 mEq/L (ref 96–112)
GFR: 115.71 mL/min (ref >60.00)
GLUCOSE: 67 mg/dL — AB (ref 70–99)
POTASSIUM: 3.5 meq/L (ref 3.5–5.1)
SODIUM: 136 meq/L (ref 135–145)
Total Bilirubin: 0.3 mg/dL (ref 0.2–1.2)
Total Protein: 7 g/dL (ref 6.0–8.3)

## 2015-01-11 LAB — LIPID PANEL
CHOL/HDL RATIO: 4
CHOLESTEROL: 184 mg/dL (ref 0–200)
HDL: 42.1 mg/dL (ref >39.00)
LDL Cholesterol: 105 mg/dL — ABNORMAL HIGH (ref 0–99)
NONHDL: 141.9
Triglycerides: 186 mg/dL — ABNORMAL HIGH (ref 0.0–149.0)
VLDL: 37.2 mg/dL (ref 0.0–40.0)

## 2015-01-11 LAB — TSH: TSH: 1.19 u[IU]/mL (ref 0.35–4.50)

## 2015-01-11 NOTE — Progress Notes (Deleted)
{***   HELP TEXT ***  This SmartLink requires parameters. It will display a table listing the base names of the components followed by the results arranged by the time the values were taken. The LabRcnt SmartLink accepts a list of result component base names separated by commas. The base names (i.e. MCH, MCV, HCT) are abbreviations for a result. For example, Red Blood Cell Count might have a base name of RBC.  This allows the user to request specific information by giving the SmartLink appropriate input.   Example:  .LabRcnt[MCH:3,MCV:*,HCT  - To indicate the NUMBER OF RESULTS for each component, type the component name followed by a colon and then the number of results.  (Component name:4).   - For ALL RESULTS for that particular component, type a star in place of the number (Component name:*).   - To obtain THE LAST RESULT for a particular component, type the component base name without the colon, number, or star. Ex: .LabRcnt[RBC   - The Amount of LOOKBACK TIME for the SmartLink to use is set for the facility in the [facility]. This setting is currently equal to 8760 hours   The above example would display a table for all components whose base name matches '[facility]', 'MCV', or 'HCT'. The first would contain the last three results of each component that has a base name of [facility], the second would contain all the entered results for components with MCV as the base name, and the third would contain the last entered result for all components with a base name of HCT.}

## 2015-01-11 NOTE — Assessment & Plan Note (Signed)
I suspect that she has more than one issue occuring here. 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  Also, her back pain is likely worsened by her large breasts- specifically her thoracic and midline lumbar pain. We discussed breast reduction today as well.  Will get xray of lumbar and thoracic spine today- may need pelvic films as well. I also suggested PT to her today.  Await results of xrays for treatment plan. The patient indicates understanding of these issues and agrees with the plan.

## 2015-01-11 NOTE — Progress Notes (Signed)
Subjective:   Patient ID: Kaitlyn Kennedy, female    DOB: 07-Jun-1981, 34 y.o.   MRN: [id number]  Kaitlyn SlimmerWaynetta Lamorte is a pleasant 34 y.o. year old female who presents to clinic today with Hip Pain and Back Pain  on 01/11/2015  HPI: Low back pain- intermittent but feels it has been getting worse for past several years.  Has two year old twins.  Bilateral, without numbness or radiation of pain to her lower extremities.  No lower extremity weakness.  Also has pain in the thoracic region at times.   Does have large breasts- wears 40 H.  Since the birth of her twins, feels her hips hurt, bilateral. No urinary symptoms.  Current Outpatient Prescriptions on File Prior to Visit  Medication Sig Dispense Refill  . clindamycin (CLINDAGEL) 1 % gel Apply topically 2 (two) times daily. 30 g 0  . phentermine 37.5 MG capsule Take 1 capsule (37.5 mg total) by mouth every morning. 60 capsule 0   No current facility-administered medications on file prior to visit.    No Known Allergies  Past Medical History  Diagnosis Date  . No pertinent past medical history     Past Surgical History  Procedure Laterality Date  . Inguinal hernia repair  1985  . Vaginal delivery  11/30/2011    Procedure: VAGINAL DELIVERY;  Surgeon: Brock Badharles A Harper, MD;  Location: [facility];  Service: Gynecology;;  . Hernia repair  1987    Family History  Problem Relation Age of Onset  . Hypertension Mother   . Hyperlipidemia Mother   . Cancer Maternal Grandmother   . Cancer Maternal Grandfather   . Cancer Paternal Grandmother   . Asthma Paternal Grandfather   . Emphysema Paternal Grandfather     History   Social History  . Marital Status: Married    Spouse Name: N/A    Number of Children: N/A  . Years of Education: N/A   Occupational History  . Not on file.   Social History Main Topics  . Smoking status: Former Games developermoker  . Smokeless tobacco: Not on file  . Alcohol Use: No  . Drug Use: No  . Sexual Activity:   Partners: Male    Birth Control/ Protection: None   Other Topics Concern  . Not on file   Social History Narrative   The PMH, PSH, Social History, Family History, Medications, and allergies [facility], and have been updated if relevant.   Review of Systems  Constitutional: Negative.   HENT: Negative.   Cardiovascular: Negative.   Gastrointestinal: Negative.   Genitourinary: Negative.   Musculoskeletal: Positive for back pain. Negative for gait problem.  Skin: Negative.   Hematological: Negative.   Psychiatric/Behavioral: Negative.   All other systems reviewed and are negative.      Objective:    BP 124/68 mmHg  Pulse 98  Temp(Src) 98.6 F (37 C) (Oral)  Ht 5\' 7"  (1.702 m)  Wt 222 lb 8 oz (100.925 kg)  BMI 34.84 kg/m2  SpO2 97%  LMP 12/18/2014   Physical Exam  Constitutional: She is oriented to person, place, and time. She appears well-developed and well-nourished. No distress.  HENT:  Head: Normocephalic.  Eyes: Conjunctivae are normal.  Neck: Normal range of motion.  Cardiovascular: Normal rate.   Pulmonary/Chest: Effort normal.  Musculoskeletal: Normal range of motion. She exhibits no edema.       Right hip: She exhibits no tenderness.       Left hip: She  exhibits no tenderness.       Lumbar back: She exhibits tenderness, pain and spasm. She exhibits normal range of motion, no swelling, no edema, no deformity, no laceration and normal pulse.       Back:  Neurological: She is alert and oriented to person, place, and time. No cranial nerve deficit.  Skin: Skin is warm and dry.  Psychiatric: She has a normal mood and affect. Her behavior is normal. Judgment and thought content normal.  Nursing note and vitals reviewed.         Assessment & Plan:   Bilateral low back pain without sciatica - Plan: DG Lumbar Spine Complete, DG Thoracic Spine W/Swimmers  Well woman exam - Plan: CBC with Differential/Platelet, Comprehensive metabolic panel,  Lipid panel, TSH No Follow-up on file.

## 2015-02-10 ENCOUNTER — Encounter: Payer: Self-pay | Admitting: Family Medicine

## 2015-02-10 ENCOUNTER — Ambulatory Visit (INDEPENDENT_AMBULATORY_CARE_PROVIDER_SITE_OTHER): Payer: Managed Care, Other (non HMO) | Admitting: Family Medicine

## 2015-02-10 DIAGNOSIS — S161XXA Strain of muscle, fascia and tendon at neck level, initial encounter: Secondary | ICD-10-CM

## 2015-02-10 MED ORDER — CYCLOBENZAPRINE HCL 5 MG PO TABS: 5.0000 mg | ORAL_TABLET | Freq: Three times a day (TID) | ORAL | Status: DC | PRN
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
## 2015-03-08 ENCOUNTER — Other Ambulatory Visit: Payer: Self-pay

## 2015-03-08 MED ORDER — CYCLOBENZAPRINE HCL 5 MG PO TABS: 5.0000 mg | ORAL_TABLET | Freq: Three times a day (TID) | ORAL | Status: DC | PRN

## 2015-03-08 NOTE — Telephone Encounter (Signed)
Patient request flexeril refill.  Last seen 02/10/2015.  Last filled 02/10/2015.  Please advise.

## 2015-03-31 ENCOUNTER — Other Ambulatory Visit: Payer: Self-pay | Admitting: Family Medicine

## 2015-03-31 MED ORDER — FLUTICASONE PROPIONATE 50 MCG/ACT NA SUSP: 2.0000 | Freq: Every day | NASAL | Status: DC

## 2015-03-31 MED ORDER — CETIRIZINE HCL 10 MG PO TABS: 10.0000 mg | ORAL_TABLET | Freq: Every day | ORAL | Status: DC

## 2015-03-31 NOTE — Addendum Note (Signed)
Addended by: Damita LackLORING, Sydnee Lamour S on: 03/31/2015 12:54 PM   Modules accepted: Orders

## 2015-04-09 ENCOUNTER — Other Ambulatory Visit: Payer: Self-pay | Admitting: Internal Medicine

## 2015-04-09 MED ORDER — PHENTERMINE HCL 37.5 MG PO CAPS: 37.5000 mg | ORAL_CAPSULE | ORAL | Status: DC

## 2015-05-17 ENCOUNTER — Other Ambulatory Visit: Payer: Self-pay | Admitting: *Deleted

## 2015-05-17 MED ORDER — PHENTERMINE HCL 37.5 MG PO CAPS: 37.5000 mg | ORAL_CAPSULE | ORAL | Status: DC

## 2015-05-27 ENCOUNTER — Encounter: Payer: Self-pay | Admitting: Family Medicine

## 2015-05-27 ENCOUNTER — Ambulatory Visit (INDEPENDENT_AMBULATORY_CARE_PROVIDER_SITE_OTHER): Payer: Managed Care, Other (non HMO) | Admitting: Family Medicine

## 2015-05-27 VITALS — BP 123/74 | HR 111 | Temp 98.2°F | Wt 228.8 lb

## 2015-05-27 DIAGNOSIS — G4489 Other headache syndrome: Secondary | ICD-10-CM | POA: Diagnosis not present

## 2015-05-27 DIAGNOSIS — R51 Headache: Secondary | ICD-10-CM

## 2015-05-27 DIAGNOSIS — R42 Dizziness and giddiness: Secondary | ICD-10-CM

## 2015-05-27 DIAGNOSIS — R000 Tachycardia, unspecified: Secondary | ICD-10-CM | POA: Diagnosis not present

## 2015-05-27 DIAGNOSIS — R519 Headache, unspecified: Secondary | ICD-10-CM | POA: Insufficient documentation

## 2015-05-27 LAB — COMPREHENSIVE METABOLIC PANEL
ALK PHOS: 59 U/L (ref 39–117)
ALT: 11 U/L (ref 0–35)
AST: 15 U/L (ref 0–37)
Albumin: 4.2 g/dL (ref 3.5–5.2)
BILIRUBIN TOTAL: 0.4 mg/dL (ref 0.2–1.2)
BUN: 8 mg/dL (ref 6–23)
CO2: 27 mEq/L (ref 19–32)
Calcium: 9.3 mg/dL (ref 8.4–10.5)
Chloride: 103 mEq/L (ref 96–112)
Creatinine, Ser: 0.84 mg/dL (ref 0.40–1.20)
GFR: 101.28 mL/min (ref >60.00)
GLUCOSE: 84 mg/dL (ref 70–99)
Potassium: 3.9 mEq/L (ref 3.5–5.1)
Sodium: 137 mEq/L (ref 135–145)
Total Protein: 7 g/dL (ref 6.0–8.3)

## 2015-05-27 LAB — CBC WITH DIFFERENTIAL/PLATELET
BASOS ABS: 0 10*3/uL (ref 0.0–0.1)
Basophils Relative: 0.2 % (ref 0.0–3.0)
EOS ABS: 0.3 10*3/uL (ref 0.0–0.7)
Eosinophils Relative: 3.2 % (ref 0.0–5.0)
HCT: 39.4 % (ref 36.0–46.0)
Hemoglobin: 13 g/dL (ref 12.0–15.0)
LYMPHS PCT: 29.4 % (ref 12.0–46.0)
Lymphs Abs: 2.9 10*3/uL (ref 0.7–4.0)
MCHC: 33 g/dL (ref 30.0–36.0)
MCV: 89.3 fl (ref 78.0–100.0)
Monocytes Absolute: 0.7 10*3/uL (ref 0.1–1.0)
Monocytes Relative: 7 % (ref 3.0–12.0)
NEUTROS PCT: 60.2 % (ref 43.0–77.0)
Neutro Abs: 5.9 10*3/uL (ref 1.4–7.7)
Platelets: 322 10*3/uL (ref 150.0–400.0)
RBC: 4.41 Mil/uL (ref 3.87–5.11)
RDW: 13.4 % (ref 11.5–15.5)
WBC: 9.9 10*3/uL (ref 4.0–10.5)

## 2015-05-27 LAB — T4, FREE: Free T4: 0.76 ng/dL (ref 0.60–1.60)

## 2015-05-27 LAB — TSH: TSH: 0.5 u[IU]/mL (ref 0.35–4.50)

## 2015-05-27 NOTE — Assessment & Plan Note (Signed)
New- intermittent. Differential is wide.  Has some migrainous features but certainly cannot exclude other etiologies. Absolutely can no longer take phentermine and discussed this with her given new onset tachycardia as well.  Check labs today- rule out thyroid dysfunction, anemia, etc.  UA and U preg negative.

## 2015-05-27 NOTE — Assessment & Plan Note (Signed)
New- d/c phentermine.  Added to allergy list. Follow up BP and pulse tomorrow. Refer for Holter if symptoms persist.  She is asymptomatic currently so beta blocker does not seem indicated. The patient indicates understanding of these issues and agrees with the plan.

## 2015-05-27 NOTE — Assessment & Plan Note (Signed)
New- see above for initial work up.

## 2015-05-27 NOTE — Progress Notes (Signed)
Subjective:   Patient ID: Kaitlyn Kennedy, female    DOB: [birth year], 34 y.o.   MRN: [id number]  Kaitlyn Kennedy is a pleasant 34 y.o. year old female who presents to clinic today with Headache and Dizziness  on 05/27/2015  HPI:  Intermittent headache/dizziness.  Started on and off a few months ago.  Recently restarted phentermine but this was occuring before she started.  Last dose several days ago.  No other new rxs or supplements.  Headaches frontal or temporal, bilateral or unilateral.  She usually just "sleeps them off."  Not occuring daily.  Maybe a couple of times a week.    Mild nausea.  Did have blurred vision once.  Felt a little short of breath yesterday.  Periods are very heavy.  Not using anything for contraception other than the rhythm method.  Does not drink caffeine.  Lab Results  Component Value Date   NA 136 01/11/2015   K 3.5 01/11/2015   CL 102 01/11/2015   CO2 30 01/11/2015   Lab Results  Component Value Date   CREATININE 0.75 01/11/2015     Lab Results  Component Value Date   TSH 1.19 01/11/2015   Lab Results  Component Value Date   WBC 9.5 01/11/2015   HGB 12.7 01/11/2015   HCT 38.0 01/11/2015   MCV 87.8 01/11/2015   PLT 277.0 01/11/2015   Current Outpatient Prescriptions on File Prior to Visit  Medication Sig Dispense Refill  . cetirizine (ZYRTEC) 10 MG tablet Take 1 tablet (10 mg total) by mouth daily. 30 tablet 11  . cyclobenzaprine (FLEXERIL) 5 MG tablet Take 1 tablet (5 mg total) by mouth 3 (three) times daily as needed for muscle spasms. 60 tablet 1  . fluticasone (FLONASE) 50 MCG/ACT nasal spray Place 2 sprays into both nostrils daily. 16 g 6   No current facility-administered medications on file prior to visit.    No Known Allergies  Past Medical History  Diagnosis Date  . No pertinent past medical history     Past Surgical History  Procedure Laterality Date  . Inguinal hernia repair  1985  . Vaginal delivery   11/30/2011    Procedure: VAGINAL DELIVERY;  Surgeon: Brock Bad, MD;  Location: [facility];  Service: Gynecology;;  . Hernia repair  1987    Family History  Problem Relation Age of Onset  . Hypertension Mother   . Hyperlipidemia Mother   . Cancer Maternal Grandmother   . Cancer Maternal Grandfather   . Cancer Paternal Grandmother   . Asthma Paternal Grandfather   . Emphysema Paternal Grandfather     History   Social History  . Marital Status: Married    Spouse Name: N/A  . Number of Children: N/A  . Years of Education: N/A   Occupational History  . Not on file.   Social History Main Topics  . Smoking status: Former Games developer  . Smokeless tobacco: Not on file  . Alcohol Use: No  . Drug Use: No  . Sexual Activity:    Partners: Male    Birth Control/ Protection: None   Other Topics Concern  . Not on file   Social History Narrative   The PMH, PSH, Social History, Family History, Medications, and allergies have been reviewed in [facility], and have been updated if relevant.    Review of Systems  Constitutional: Negative.   Eyes: Positive for visual disturbance.  Respiratory: Positive for shortness of breath. Negative for wheezing and stridor.  Cardiovascular: Negative for palpitations.  Gastrointestinal: Positive for nausea. Negative for vomiting, abdominal pain, diarrhea, constipation, blood in stool, abdominal distention, anal bleeding and rectal pain.  Endocrine: Positive for heat intolerance.  Genitourinary: Negative.   Allergic/Immunologic: Negative.   Neurological: Positive for dizziness and headaches. Negative for tremors, seizures, syncope, facial asymmetry, speech difficulty, weakness, light-headedness and numbness.  Hematological: Negative.   Psychiatric/Behavioral: Negative.   All other systems reviewed and are negative.      Objective:    BP 123/74 mmHg  Pulse 111  Temp(Src) 98.2 F (36.8 C) (Oral)  Wt 228 lb 12 oz (103.76 kg)  SpO2 98%  LMP  05/16/2015   Physical Exam  Constitutional: She is oriented to person, place, and time. She appears well-developed and well-nourished. No distress.  HENT:  Head: Normocephalic.  Eyes: Conjunctivae are normal.  Neck: Normal range of motion. Neck supple. No thyromegaly present.  Cardiovascular: Regular rhythm.  Tachycardia present.   Pulmonary/Chest: Breath sounds normal. No respiratory distress. She has no wheezes. She has no rales. She exhibits no tenderness.  Musculoskeletal: Normal range of motion. She exhibits no edema.  Neurological: She is alert and oriented to person, place, and time. No cranial nerve deficit. Coordination normal.  Skin: Skin is warm and dry.  Psychiatric: She has a normal mood and affect. Her behavior is normal. Judgment and thought content normal.  Nursing note and vitals reviewed.         Assessment & Plan:   Other headache syndrome  Dizziness and giddiness - Plan: TSH, T4, Free, CBC with Differential/Platelet, Comprehensive metabolic panel  Tachycardia - Plan: TSH, T4, Free, CBC with Differential/Platelet, Comprehensive metabolic panel No Follow-up on file.

## 2015-06-22 ENCOUNTER — Other Ambulatory Visit: Payer: Self-pay | Admitting: Family Medicine

## 2015-06-22 MED ORDER — IBUPROFEN 800 MG PO TABS: 800.0000 mg | ORAL_TABLET | Freq: Three times a day (TID) | ORAL | Status: DC | PRN

## 2015-11-19 ENCOUNTER — Other Ambulatory Visit: Payer: Self-pay | Admitting: *Deleted

## 2015-11-19 MED ORDER — IBUPROFEN 800 MG PO TABS: 800.0000 mg | ORAL_TABLET | Freq: Three times a day (TID) | ORAL | Status: DC | PRN

## 2015-12-07 ENCOUNTER — Other Ambulatory Visit: Payer: Self-pay | Admitting: Internal Medicine

## 2015-12-07 MED ORDER — AZITHROMYCIN 250 MG PO TABS: ORAL_TABLET | ORAL | Status: DC

## 2015-12-07 MED ORDER — HYDROCODONE-HOMATROPINE 5-1.5 MG/5ML PO SYRP: 5.0000 mL | ORAL_SOLUTION | Freq: Three times a day (TID) | ORAL | Status: DC | PRN

## 2016-01-03 ENCOUNTER — Other Ambulatory Visit: Payer: Self-pay | Admitting: Family Medicine

## 2016-01-03 MED ORDER — AMOXICILLIN-POT CLAVULANATE 875-125 MG PO TABS: 1.0000 | ORAL_TABLET | Freq: Two times a day (BID) | ORAL | Status: AC

## 2016-01-10 ENCOUNTER — Other Ambulatory Visit: Payer: Self-pay | Admitting: Family Medicine

## 2016-01-19 ENCOUNTER — Other Ambulatory Visit: Payer: Self-pay | Admitting: Family Medicine

## 2016-01-19 MED ORDER — FLUCONAZOLE 150 MG PO TABS: 150.0000 mg | ORAL_TABLET | Freq: Once | ORAL | Status: AC

## 2016-01-27 ENCOUNTER — Other Ambulatory Visit: Payer: Self-pay | Admitting: Family Medicine

## 2016-01-27 ENCOUNTER — Other Ambulatory Visit (INDEPENDENT_AMBULATORY_CARE_PROVIDER_SITE_OTHER): Payer: Managed Care, Other (non HMO)

## 2016-01-27 DIAGNOSIS — Z01419 Encounter for gynecological examination (general) (routine) without abnormal findings: Secondary | ICD-10-CM

## 2016-01-27 DIAGNOSIS — Z0000 Encounter for general adult medical examination without abnormal findings: Secondary | ICD-10-CM | POA: Diagnosis not present

## 2016-01-27 LAB — CBC WITH DIFFERENTIAL/PLATELET
BASOS ABS: 0.1 10*3/uL (ref 0.0–0.1)
BASOS PCT: 0.5 % (ref 0.0–3.0)
EOS PCT: 3 % (ref 0.0–5.0)
Eosinophils Absolute: 0.3 10*3/uL (ref 0.0–0.7)
HEMATOCRIT: 38.6 % (ref 36.0–46.0)
Hemoglobin: 12.6 g/dL (ref 12.0–15.0)
LYMPHS PCT: 33.5 % (ref 12.0–46.0)
Lymphs Abs: 3.8 10*3/uL (ref 0.7–4.0)
MCHC: 32.7 g/dL (ref 30.0–36.0)
MCV: 88.7 fl (ref 78.0–100.0)
MONOS PCT: 7.2 % (ref 3.0–12.0)
Monocytes Absolute: 0.8 10*3/uL (ref 0.1–1.0)
NEUTROS ABS: 6.3 10*3/uL (ref 1.4–7.7)
Neutrophils Relative %: 55.8 % (ref 43.0–77.0)
PLATELETS: 318 10*3/uL (ref 150.0–400.0)
RBC: 4.35 Mil/uL (ref 3.87–5.11)
RDW: 13.4 % (ref 11.5–15.5)
WBC: 11.3 10*3/uL — ABNORMAL HIGH (ref 4.0–10.5)

## 2016-01-27 LAB — LIPID PANEL
Cholesterol: 171 mg/dL (ref 0–200)
HDL: 45.7 mg/dL
LDL Cholesterol: 109 mg/dL — ABNORMAL HIGH (ref 0–99)
NonHDL: 125.37
Total CHOL/HDL Ratio: 4
Triglycerides: 80 mg/dL (ref 0.0–149.0)
VLDL: 16 mg/dL (ref 0.0–40.0)

## 2016-01-27 LAB — COMPREHENSIVE METABOLIC PANEL
ALK PHOS: 62 U/L (ref 39–117)
ALT: 26 U/L (ref 0–35)
AST: 25 U/L (ref 0–37)
Albumin: 4.3 g/dL (ref 3.5–5.2)
BILIRUBIN TOTAL: 0.3 mg/dL (ref 0.2–1.2)
BUN: 11 mg/dL (ref 6–23)
CALCIUM: 9.3 mg/dL (ref 8.4–10.5)
CO2: 27 meq/L (ref 19–32)
Chloride: 105 mEq/L (ref 96–112)
Creatinine, Ser: 0.72 mg/dL (ref 0.40–1.20)
GFR: 120.49 mL/min (ref >60.00)
Glucose, Bld: 76 mg/dL (ref 70–99)
POTASSIUM: 3.7 meq/L (ref 3.5–5.1)
Sodium: 138 mEq/L (ref 135–145)
Total Protein: 7.4 g/dL (ref 6.0–8.3)

## 2016-01-27 LAB — TSH: TSH: 0.89 u[IU]/mL (ref 0.35–4.50)

## 2016-01-31 ENCOUNTER — Encounter: Payer: Self-pay | Admitting: *Deleted

## 2016-03-06 ENCOUNTER — Other Ambulatory Visit: Payer: Self-pay

## 2016-03-06 MED ORDER — IBUPROFEN 800 MG PO TABS: 800.0000 mg | ORAL_TABLET | Freq: Three times a day (TID) | ORAL | Status: DC | PRN

## 2016-03-06 NOTE — Telephone Encounter (Signed)
Rx sent electronically.  

## 2016-03-16 ENCOUNTER — Other Ambulatory Visit: Payer: Self-pay

## 2016-03-16 MED ORDER — PHENTERMINE HCL 37.5 MG PO CAPS: 37.5000 mg | ORAL_CAPSULE | ORAL | Status: DC

## 2016-03-16 MED FILL — PHENTERMINE 37.5 MG TABLET: 37.5 | 60 days supply | Qty: 60 | Fill #0

## 2016-03-16 NOTE — Telephone Encounter (Signed)
Ok to print per Dr Aron 

## 2016-04-17 ENCOUNTER — Other Ambulatory Visit: Payer: Self-pay

## 2016-04-17 MED ORDER — IBUPROFEN 800 MG PO TABS: 800.0000 mg | ORAL_TABLET | Freq: Three times a day (TID) | ORAL | Status: DC | PRN

## 2016-06-12 ENCOUNTER — Telehealth: Payer: Self-pay | Admitting: Family Medicine

## 2016-06-12 DIAGNOSIS — N3001 Acute cystitis with hematuria: Secondary | ICD-10-CM

## 2016-06-12 MED ORDER — CIPROFLOXACIN HCL 500 MG PO TABS: 500.0000 mg | ORAL_TABLET | Freq: Two times a day (BID) | ORAL | Status: DC

## 2016-06-12 NOTE — Telephone Encounter (Signed)
Pt with dysuria and increased urinary frequency.  UA pos, urine cx ordered. eRx sent for cipro to pharmacy on file.

## 2016-06-14 LAB — URINE CULTURE
Colony Count: NO GROWTH
ORGANISM ID, BACTERIA: NO GROWTH

## 2016-09-12 ENCOUNTER — Other Ambulatory Visit: Payer: Self-pay | Admitting: Internal Medicine

## 2016-09-12 ENCOUNTER — Other Ambulatory Visit (INDEPENDENT_AMBULATORY_CARE_PROVIDER_SITE_OTHER): Payer: Managed Care, Other (non HMO) | Admitting: Family Medicine

## 2016-09-12 DIAGNOSIS — Z113 Encounter for screening for infections with a predominantly sexual mode of transmission: Secondary | ICD-10-CM

## 2016-09-13 LAB — HIV ANTIBODY (ROUTINE TESTING W REFLEX): HIV: NONREACTIVE

## 2016-09-13 LAB — GC/CHLAMYDIA PROBE AMP
CT PROBE, AMP APTIMA: NOT DETECTED
GC Probe RNA: NOT DETECTED

## 2016-09-13 LAB — RPR

## 2016-09-29 ENCOUNTER — Telehealth: Payer: Self-pay

## 2016-09-29 NOTE — Telephone Encounter (Signed)
[facility] had question about medication I could not find on med list and transferred call to Pamala Hurry Baity NP.FYI to Pamala Hurry Baity NP.

## 2016-10-23 ENCOUNTER — Encounter: Payer: Self-pay | Admitting: Family Medicine

## 2016-10-23 ENCOUNTER — Ambulatory Visit (INDEPENDENT_AMBULATORY_CARE_PROVIDER_SITE_OTHER): Payer: Managed Care, Other (non HMO) | Admitting: Family Medicine

## 2016-10-23 VITALS — BP 114/72 | HR 102 | Temp 98.6°F | Wt 209.0 lb

## 2016-10-23 DIAGNOSIS — Z202 Contact with and (suspected) exposure to infections with a predominantly sexual mode of transmission: Secondary | ICD-10-CM

## 2016-10-23 DIAGNOSIS — R300 Dysuria: Secondary | ICD-10-CM | POA: Diagnosis not present

## 2016-10-23 LAB — POC URINALSYSI DIPSTICK (AUTOMATED)
Bilirubin, UA: NEGATIVE
Blood, UA: 10
GLUCOSE UA: NEGATIVE
KETONES UA: NEGATIVE
LEUKOCYTES UA: NEGATIVE
Nitrite, UA: NEGATIVE
Urobilinogen, UA: 0.2
pH, UA: 6

## 2016-10-23 NOTE — Progress Notes (Signed)
SUBJECTIVE: Kaitlyn Kennedy is a 35 y.o. female who complains of urinary frequency, urgency and dysuria x 3 days, without flank pain, fever, chills, or abnormal vaginal discharge or bleeding.   Current Outpatient Prescriptions on File Prior to Visit  Medication Sig Dispense Refill  . cyclobenzaprine (FLEXERIL) 5 MG tablet Take 1 tablet (5 mg total) by mouth 3 (three) times daily as needed for muscle spasms. 60 tablet 1  . ibuprofen (ADVIL,MOTRIN) 800 MG tablet Take 1 tablet (800 mg total) by mouth every 8 (eight) hours as needed. 30 tablet 1   No current facility-administered medications on file prior to visit.     No Known Allergies  Past Medical History:  Diagnosis Date  . No pertinent past medical history     Past Surgical History:  Procedure Laterality Date  . HERNIA REPAIR  1987  . INGUINAL HERNIA REPAIR  1985  . VAGINAL DELIVERY  11/30/2011   Procedure: VAGINAL DELIVERY;  Surgeon: Brock Badharles A Harper, MD;  Location: [facility];  Service: Gynecology;;    Family History  Problem Relation Age of Onset  . Hypertension Mother   . Hyperlipidemia Mother   . Cancer Maternal Grandmother   . Cancer Maternal Grandfather   . Cancer Paternal Grandmother   . Asthma Paternal Grandfather   . Emphysema Paternal Grandfather     Social History   Social History  . Marital status: Married    Spouse name: N/A  . Number of children: N/A  . Years of education: N/A   Occupational History  . Not on file.   Social History Main Topics  . Smoking status: Former Games developermoker  . Smokeless tobacco: Not on file  . Alcohol use No  . Drug use: No  . Sexual activity: Yes    Partners: Male    Birth control/ protection: None   Other Topics Concern  . Not on file   Social History Narrative  . No narrative on file   The PMH, PSH, Social History, Family History, Medications, and allergies [facility], and have been updated if relevant.  OBJECTIVE:  BP 114/72   Pulse (!) 102   Temp  98.6 F (37 C) (Oral)   Wt 209 lb (94.8 kg)   LMP 10/08/2016   SpO2 98%   BMI 32.73 kg/m   Appears well, in no apparent distress.  Vital signs are normal. The abdomen is soft without tenderness, guarding, mass, rebound or organomegaly. No CVA tenderness or inguinal adenopathy noted. Urine dipstick shows positive for RBC's.    ASSESSMENT: UA neg, symptoms resolving  PLAN: Treatment per orders - STD panel, also push fluids, may use Pyridium OTC prn. Call or return to clinic prn if these symptoms worsen or fail to improve as anticipated.

## 2016-10-24 ENCOUNTER — Telehealth: Payer: Self-pay | Admitting: *Deleted

## 2016-10-24 LAB — HIV ANTIBODY (ROUTINE TESTING W REFLEX): HIV 1&2 Ab, 4th Generation: NONREACTIVE

## 2016-10-24 LAB — GC/CHLAMYDIA PROBE AMP
CT Probe RNA: NOT DETECTED
GC Probe RNA: NOT DETECTED

## 2016-10-24 LAB — WET PREP BY MOLECULAR PROBE
Candida species: POSITIVE — AB
Gardnerella vaginalis: NEGATIVE
Trichomonas vaginosis: NEGATIVE

## 2016-10-24 LAB — RPR

## 2016-10-24 MED ORDER — FLUCONAZOLE 150 MG PO TABS: 150.0000 mg | ORAL_TABLET | Freq: Once | ORAL | 2 refills | Status: AC

## 2016-10-24 NOTE — Telephone Encounter (Signed)
Rx sent due to yeast inf.

## 2016-12-25 ENCOUNTER — Other Ambulatory Visit: Payer: Self-pay | Admitting: Family Medicine

## 2016-12-25 ENCOUNTER — Other Ambulatory Visit (INDEPENDENT_AMBULATORY_CARE_PROVIDER_SITE_OTHER): Payer: Managed Care, Other (non HMO) | Admitting: Family Medicine

## 2016-12-25 DIAGNOSIS — L298 Other pruritus: Secondary | ICD-10-CM

## 2016-12-25 DIAGNOSIS — N898 Other specified noninflammatory disorders of vagina: Secondary | ICD-10-CM

## 2016-12-25 DIAGNOSIS — Z113 Encounter for screening for infections with a predominantly sexual mode of transmission: Secondary | ICD-10-CM

## 2016-12-25 NOTE — Addendum Note (Signed)
Addended by: Shon MilletWATLINGTON, Jshaun Abernathy M on: 12/25/2016 09:06 AM   Modules accepted: Orders

## 2016-12-25 NOTE — Addendum Note (Signed)
Addended by: Liane ComberHAVERS, Juaquina Machnik C on: 12/25/2016 10:26 AM   Modules accepted: Orders

## 2016-12-25 NOTE — Addendum Note (Signed)
Addended by: Baldomero LamyHAVERS, Storie Heffern C on: 12/25/2016 09:50 AM   Modules accepted: Orders

## 2016-12-26 LAB — WET PREP BY MOLECULAR PROBE
CANDIDA SPECIES: NEGATIVE
GARDNERELLA VAGINALIS: NEGATIVE
TRICHOMONAS VAG: NEGATIVE

## 2016-12-26 LAB — GC/CHLAMYDIA PROBE AMP
CT Probe RNA: NOT DETECTED
GC PROBE AMP APTIMA: NOT DETECTED

## 2016-12-26 LAB — RPR

## 2017-01-20 ENCOUNTER — Other Ambulatory Visit: Payer: Self-pay | Admitting: Family Medicine

## 2017-01-21 ENCOUNTER — Other Ambulatory Visit: Payer: Self-pay | Admitting: Internal Medicine

## 2017-01-21 MED ORDER — KETOCONAZOLE 200 MG PO TABS: 100.0000 mg | ORAL_TABLET | Freq: Every day | ORAL | 5 refills | Status: DC

## 2017-02-06 ENCOUNTER — Ambulatory Visit (INDEPENDENT_AMBULATORY_CARE_PROVIDER_SITE_OTHER): Payer: Managed Care, Other (non HMO) | Admitting: Internal Medicine

## 2017-02-06 ENCOUNTER — Encounter: Payer: Self-pay | Admitting: Internal Medicine

## 2017-02-06 VITALS — BP 122/80 | HR 91 | Temp 98.5°F | Wt 207.8 lb

## 2017-02-06 DIAGNOSIS — J069 Acute upper respiratory infection, unspecified: Secondary | ICD-10-CM | POA: Diagnosis not present

## 2017-02-06 DIAGNOSIS — R6889 Other general symptoms and signs: Secondary | ICD-10-CM

## 2017-02-06 DIAGNOSIS — B9789 Other viral agents as the cause of diseases classified elsewhere: Secondary | ICD-10-CM

## 2017-02-06 LAB — POC INFLUENZA A&B (BINAX/QUICKVUE)
Influenza A, POC: NEGATIVE
Influenza B, POC: NEGATIVE

## 2017-02-06 MED ORDER — HYDROCODONE-HOMATROPINE 5-1.5 MG/5ML PO SYRP: 5.0000 mL | ORAL_SOLUTION | Freq: Three times a day (TID) | ORAL | 0 refills | Status: DC | PRN

## 2017-02-06 NOTE — Progress Notes (Signed)
HPI  Pt presents to the clinic today with c/o ear pain, nasal congestion and cough. This started 4 days ago. She describes the ear pain as sharp and stabbing. She denies decreased hearing. She is blowing green mucous out of her nose. The cough is nonproductive. She denies fever, chills or body aches, but has been fatigued. She has tried Visual merchandiserAlka Seltzer and Theraflu with minimal relief. She has no history of allergies or breathing problems. She has had sick contacts. She did get her flu shot.  Review of Systems        Past Medical History:  Diagnosis Date  . No pertinent past medical history     Family History  Problem Relation Age of Onset  . Hypertension Mother   . Hyperlipidemia Mother   . Cancer Maternal Grandmother   . Cancer Maternal Grandfather   . Cancer Paternal Grandmother   . Asthma Paternal Grandfather   . Emphysema Paternal Grandfather     Social History   Social History  . Marital status: Married    Spouse name: N/A  . Number of children: N/A  . Years of education: N/A   Occupational History  . Not on file.   Social History Main Topics  . Smoking status: Former Games developermoker  . Smokeless tobacco: Never Used  . Alcohol use No  . Drug use: No  . Sexual activity: Yes    Partners: Male    Birth control/ protection: None   Other Topics Concern  . Not on file   Social History Narrative  . No narrative on file    No Known Allergies   Constitutional: Positive headache, fatigue. Denies fever or abrupt weight changes.  HEENT:  Positive ear pain and nasal congestion. Denies eye redness, eye pain, pressure behind the eyes, facial pain, ringing in the ears, wax buildup, runny nose or sore throat. Respiratory: Positive cough. Denies difficulty breathing or shortness of breath.  Cardiovascular: Denies chest pain, chest tightness, palpitations or swelling in the hands or feet.   No other specific complaints in a complete review of systems (except as listed in HPI  above).  Objective:   BP 122/80   Pulse 91   Temp 98.5 F (36.9 C) (Oral)   Wt 207 lb 12 oz (94.2 kg)   LMP 01/24/2017   SpO2 99%   BMI 32.54 kg/m  Wt Readings from Last 3 Encounters:  02/06/17 207 lb 12 oz (94.2 kg)  10/23/16 209 lb (94.8 kg)  05/27/15 228 lb 12 oz (103.8 kg)     General: Appears her stated age, in NAD. HEENT: Head: normal shape and size, maxillary sinus pressure noted; Ears: Tm's gray and intact, normal light reflex; Nose: mucosa boggy and moist, septum midline; Throat/Mouth: + PND. Teeth present, mucosa pink and moist, no exudate noted, no lesions or ulcerations noted.  Neck: Bilateral anterior cervical lymphadenopathy.  Cardiovascular: Normal rate and rhythm. S1,S2 noted.  No murmur, rubs or gallops noted.  Pulmonary/Chest: Normal effort and positive vesicular breath sounds. No respiratory distress. No wheezes, rales or ronchi noted.       Assessment & Plan:   Viral Upper Respiratory Infection with Cough:  Rapid Flu: negative Get some rest and drink plenty of water Try Flonase and Mucinex OTC Rx for Hycodan cough syrup  RTC as needed or if symptoms persist.   Nicki ReaperBAITY, Kayvion Arneson, NP

## 2017-02-06 NOTE — Patient Instructions (Signed)
Cough, Adult  A cough helps to clear your throat and lungs. A cough may last only 2?3 weeks (acute), or it may last longer than 8 weeks (chronic). Many different things can cause a cough. A cough may be a sign of an illness or another medical condition.  Follow these instructions at home:  ? Pay attention to any changes in your cough.  ? Take medicines only as told by your doctor.  ? If you were prescribed an antibiotic medicine, take it as told by your doctor. Do not stop taking it even if you start to feel better.  ? Talk with your doctor before you try using a cough medicine.  ? Drink enough fluid to keep your pee (urine) clear or pale yellow.  ? If the air is dry, use a cold steam vaporizer or humidifier in your home.  ? Stay away from things that make you cough at work or at home.  ? If your cough is worse at night, try using extra pillows to raise your head up higher while you sleep.  ? Do not smoke, and try not to be around smoke. If you need help quitting, ask your doctor.  ? Do not have caffeine.  ? Do not drink alcohol.  ? Rest as needed.  Contact a doctor if:  ? You have new problems (symptoms).  ? You cough up yellow fluid (pus).  ? Your cough does not get better after 2?3 weeks, or your cough gets worse.  ? Medicine does not help your cough and you are not sleeping well.  ? You have pain that gets worse or pain that is not helped with medicine.  ? You have a fever.  ? You are losing weight and you do not know why.  ? You have night sweats.  Get help right away if:  ? You cough up blood.  ? You have trouble breathing.  ? Your heartbeat is very fast.  This information is not intended to replace advice given to you by your health care provider. Make sure you discuss any questions you have with your health care provider.  Document Released: 08/10/2011 Document Revised: 05/04/2016 Document Reviewed: 02/03/2015  Elsevier Interactive Patient Education ? 2017 [employer]

## 2017-02-06 NOTE — Addendum Note (Signed)
Addended by: Roena MaladyEVONTENNO, Chyna Kneece Y on: 02/06/2017 12:05 PM   Modules accepted: Orders

## 2017-02-08 ENCOUNTER — Other Ambulatory Visit: Payer: Self-pay | Admitting: Internal Medicine

## 2017-02-08 MED ORDER — AZITHROMYCIN 250 MG PO TABS: ORAL_TABLET | ORAL | 0 refills | Status: DC

## 2017-02-19 ENCOUNTER — Encounter: Payer: Managed Care, Other (non HMO) | Admitting: Internal Medicine

## 2017-02-19 NOTE — Progress Notes (Deleted)
   Subjective:    Patient ID: Kaitlyn Kennedy, female    DOB: 1981/08/25, 36 y.o.   MRN: [id number]  HPI  Pt presents to the clinic today for her annual exam.  Flu: Tetanus: Pap Smear: Dentist:  Diet: Exercise:  Review of Systems  Past Medical History:  Diagnosis Date  . No pertinent past medical history     Current Outpatient Prescriptions  Medication Sig Dispense Refill  . cyclobenzaprine (FLEXERIL) 5 MG tablet Take 1 tablet (5 mg total) by mouth 3 (three) times daily as needed for muscle spasms. 60 tablet 1  . ibuprofen (ADVIL,MOTRIN) 800 MG tablet Take 1 tablet (800 mg total) by mouth every 8 (eight) hours as needed. 30 tablet 1  . JUNEL 1.5/30 1.5-30 MG-MCG tablet Take 1 tablet by mouth daily.     Marland Kitchen. ketoconazole (NIZORAL) 200 MG tablet Take 0.5 tablets (100 mg total) by mouth daily. 30 tablet 5   No current facility-administered medications for this visit.     No Known Allergies  Family History  Problem Relation Age of Onset  . Hypertension Mother   . Hyperlipidemia Mother   . Cancer Maternal Grandmother   . Cancer Maternal Grandfather   . Cancer Paternal Grandmother   . Asthma Paternal Grandfather   . Emphysema Paternal Grandfather     Social History   Social History  . Marital status: Married    Spouse name: N/A  . Number of children: N/A  . Years of education: N/A   Occupational History  . Not on file.   Social History Main Topics  . Smoking status: Former Games developermoker  . Smokeless tobacco: Never Used  . Alcohol use No  . Drug use: No  . Sexual activity: Yes    Partners: Male    Birth control/ protection: None   Other Topics Concern  . Not on file   Social History Narrative  . No narrative on file     Constitutional: Denies fever, malaise, fatigue, headache or abrupt weight changes.  HEENT: Denies eye pain, eye redness, ear pain, ringing in the ears, wax buildup, runny nose, nasal congestion, bloody nose, or sore throat. Respiratory:  Denies difficulty breathing, shortness of breath, cough or sputum production.   Cardiovascular: Denies chest pain, chest tightness, palpitations or swelling in the hands or feet.  Gastrointestinal: Denies abdominal pain, bloating, constipation, diarrhea or blood in the stool.  GU: Denies urgency, frequency, pain with urination, burning sensation, blood in urine, odor or discharge. Musculoskeletal: Denies decrease in range of motion, difficulty with gait, muscle pain or joint pain and swelling.  Skin: Denies redness, rashes, lesions or ulcercations.  Neurological: Denies dizziness, difficulty with memory, difficulty with speech or problems with balance and coordination.  Psych: Denies anxiety, depression, SI/HI.  No other specific complaints in a complete review of systems (except as listed in HPI above).     Objective:   Physical Exam        Assessment & Plan:

## 2017-02-26 ENCOUNTER — Other Ambulatory Visit: Payer: Self-pay | Admitting: Internal Medicine

## 2017-02-26 ENCOUNTER — Encounter: Payer: Self-pay | Admitting: Internal Medicine

## 2017-02-26 ENCOUNTER — Ambulatory Visit (INDEPENDENT_AMBULATORY_CARE_PROVIDER_SITE_OTHER): Payer: Managed Care, Other (non HMO) | Admitting: Internal Medicine

## 2017-02-26 VITALS — BP 128/84 | HR 69 | Temp 98.5°F | Ht 67.0 in | Wt 209.2 lb

## 2017-02-26 DIAGNOSIS — Z0000 Encounter for general adult medical examination without abnormal findings: Secondary | ICD-10-CM

## 2017-02-26 LAB — COMPREHENSIVE METABOLIC PANEL
ALBUMIN: 4.1 g/dL (ref 3.5–5.2)
ALT: 12 U/L (ref 0–35)
AST: 13 U/L (ref 0–37)
Alkaline Phosphatase: 42 U/L (ref 39–117)
BUN: 9 mg/dL (ref 6–23)
CHLORIDE: 104 meq/L (ref 96–112)
CO2: 30 meq/L (ref 19–32)
CREATININE: 0.68 mg/dL (ref 0.40–1.20)
Calcium: 9.3 mg/dL (ref 8.4–10.5)
GFR: 127.84 mL/min (ref >60.00)
Glucose, Bld: 88 mg/dL (ref 70–99)
Potassium: 3.7 mEq/L (ref 3.5–5.1)
SODIUM: 137 meq/L (ref 135–145)
Total Bilirubin: 0.3 mg/dL (ref 0.2–1.2)
Total Protein: 7 g/dL (ref 6.0–8.3)

## 2017-02-26 LAB — LIPID PANEL
CHOL/HDL RATIO: 3
Cholesterol: 175 mg/dL (ref 0–200)
HDL: 62.2 mg/dL (ref >39.00)
LDL CALC: 97 mg/dL (ref 0–99)
NonHDL: 113.01
Triglycerides: 82 mg/dL (ref 0.0–149.0)
VLDL: 16.4 mg/dL (ref 0.0–40.0)

## 2017-02-26 LAB — CBC
HEMATOCRIT: 38.9 % (ref 36.0–46.0)
HEMOGLOBIN: 12.9 g/dL (ref 12.0–15.0)
MCHC: 33.3 g/dL (ref 30.0–36.0)
MCV: 90.8 fl (ref 78.0–100.0)
PLATELETS: 389 10*3/uL (ref 150.0–400.0)
RBC: 4.29 Mil/uL (ref 3.87–5.11)
RDW: 13.3 % (ref 11.5–15.5)
WBC: 13 10*3/uL — ABNORMAL HIGH (ref 4.0–10.5)

## 2017-02-26 LAB — HEMOGLOBIN A1C: HEMOGLOBIN A1C: 5.7 % (ref 4.6–6.5)

## 2017-02-26 MED ORDER — JUNEL 1.5/30 1.5-30 MG-MCG PO TABS: 1.0000 | ORAL_TABLET | Freq: Every day | ORAL | 3 refills | Status: DC

## 2017-02-26 MED ORDER — OMEPRAZOLE 20 MG PO TBEC: 1.0000 | DELAYED_RELEASE_TABLET | Freq: Every day | ORAL | 3 refills | Status: DC

## 2017-02-26 MED ORDER — CYCLOBENZAPRINE HCL 5 MG PO TABS: 5.0000 mg | ORAL_TABLET | Freq: Three times a day (TID) | ORAL | 3 refills | Status: DC | PRN

## 2017-02-26 MED ORDER — IBUPROFEN 800 MG PO TABS: 800.0000 mg | ORAL_TABLET | Freq: Three times a day (TID) | ORAL | 3 refills | Status: DC | PRN

## 2017-02-26 NOTE — Patient Instructions (Signed)
Health Maintenance, Female Adopting a healthy lifestyle and getting preventive care can go a long way to promote health and wellness. Talk with your health care provider about what schedule of regular examinations is right for you. This is a good chance for you to check in with your provider about disease prevention and staying healthy. In between checkups, there are plenty of things you can do on your own. Experts have done a lot of research about which lifestyle changes and preventive measures are most likely to keep you healthy. Ask your health care provider for more information. Weight and diet Eat a healthy diet  Be sure to include plenty of vegetables, fruits, low-fat dairy products, and lean protein.  Do not eat a lot of foods high in solid fats, added sugars, or salt.  Get regular exercise. This is one of the most important things you can do for your health.  Most adults should exercise for at least 150 minutes each week. The exercise should increase your heart rate and make you sweat (moderate-intensity exercise).  Most adults should also do strengthening exercises at least twice a week. This is in addition to the moderate-intensity exercise. Maintain a healthy weight  Body mass index (BMI) is a measurement that can be used to identify possible weight problems. It estimates body fat based on height and weight. Your health care provider can help determine your BMI and help you achieve or maintain a healthy weight.  For females 76 years of age and older:  A BMI below 18.5 is considered underweight.  A BMI of 18.5 to 24.9 is normal.  A BMI of 25 to 29.9 is considered overweight.  A BMI of 30 and above is considered obese. Watch levels of cholesterol and blood lipids  You should start having your blood tested for lipids and cholesterol at 34 years of age, then have this test every 5 years.  You may need to have your cholesterol levels checked more often if:  Your lipid or  cholesterol levels are high.  You are older than 34 years of age.  You are at high risk for heart disease. Cancer screening Lung Cancer  Lung cancer screening is recommended for adults 64-42 years old who are at high risk for lung cancer because of a history of smoking.  A yearly low-dose CT scan of the lungs is recommended for people who:  Currently smoke.  Have quit within the past 15 years.  Have at least a 30-pack-year history of smoking. A pack year is smoking an average of one pack of cigarettes a day for 1 year.  Yearly screening should continue until it has been 15 years since you quit.  Yearly screening should stop if you develop a health problem that would prevent you from having lung cancer treatment. Breast Cancer  Practice breast self-awareness. This means understanding how your breasts normally appear and feel.  It also means doing regular breast self-exams. Let your health care provider know about any changes, no matter how small.  If you are in your 20s or 30s, you should have a clinical breast exam (CBE) by a health care provider every 1-3 years as part of a regular health exam.  If you are 34 or older, have a CBE every year. Also consider having a breast X-ray (mammogram) every year.  If you have a family history of breast cancer, talk to your health care provider about genetic screening.  If you are at high risk for breast cancer, talk  to your health care provider about having an MRI and a mammogram every year.  Breast cancer gene (BRCA) assessment is recommended for women who have family members with BRCA-related cancers. BRCA-related cancers include:  Breast.  Ovarian.  Tubal.  Peritoneal cancers.  Results of the assessment will determine the need for genetic counseling and BRCA1 and BRCA2 testing. Cervical Cancer  Your health care provider may recommend that you be screened regularly for cancer of the pelvic organs (ovaries, uterus, and vagina).  This screening involves a pelvic examination, including checking for microscopic changes to the surface of your cervix (Pap test). You may be encouraged to have this screening done every 3 years, beginning at age 24.  For women ages 66-65, health care providers may recommend pelvic exams and Pap testing every 3 years, or they may recommend the Pap and pelvic exam, combined with testing for human papilloma virus (HPV), every 5 years. Some types of HPV increase your risk of cervical cancer. Testing for HPV may also be done on women of any age with unclear Pap test results.  Other health care providers may not recommend any screening for nonpregnant women who are considered low risk for pelvic cancer and who do not have symptoms. Ask your health care provider if a screening pelvic exam is right for you.  If you have had past treatment for cervical cancer or a condition that could lead to cancer, you need Pap tests and screening for cancer for at least 20 years after your treatment. If Pap tests have been discontinued, your risk factors (such as having a new sexual partner) need to be reassessed to determine if screening should resume. Some women have medical problems that increase the chance of getting cervical cancer. In these cases, your health care provider may recommend more frequent screening and Pap tests. Colorectal Cancer  This type of cancer can be detected and often prevented.  Routine colorectal cancer screening usually begins at 34 years of age and continues through 34 years of age.  Your health care provider may recommend screening at an earlier age if you have risk factors for colon cancer.  Your health care provider may also recommend using home test kits to check for hidden blood in the stool.  A small camera at the end of a tube can be used to examine your colon directly (sigmoidoscopy or colonoscopy). This is done to check for the earliest forms of colorectal cancer.  Routine  screening usually begins at age 41.  Direct examination of the colon should be repeated every 5-10 years through 35 years of age. However, you may need to be screened more often if early forms of precancerous polyps or small growths are found. Skin Cancer  Check your skin from head to toe regularly.  Tell your health care provider about any new moles or changes in moles, especially if there is a change in a mole's shape or color.  Also tell your health care provider if you have a mole that is larger than the size of a pencil eraser.  Always use sunscreen. Apply sunscreen liberally and repeatedly throughout the day.  Protect yourself by wearing long sleeves, pants, a wide-brimmed hat, and sunglasses whenever you are outside. Heart disease, diabetes, and high blood pressure  High blood pressure causes heart disease and increases the risk of stroke. High blood pressure is more likely to develop in:  People who have blood pressure in the high end of the normal range (130-139/85-89 mm Hg).  People who are overweight or obese.  People who are African American.  If you are 59-24 years of age, have your blood pressure checked every 3-5 years. If you are 34 years of age or older, have your blood pressure checked every year. You should have your blood pressure measured twice-once when you are at a hospital or clinic, and once when you are not at a hospital or clinic. Record the average of the two measurements. To check your blood pressure when you are not at a hospital or clinic, you can use:  An automated blood pressure machine at a pharmacy.  A home blood pressure monitor.  If you are between 29 years and 60 years old, ask your health care provider if you should take aspirin to prevent strokes.  Have regular diabetes screenings. This involves taking a blood sample to check your fasting blood sugar level.  If you are at a normal weight and have a low risk for diabetes, have this test once  every three years after 34 years of age.  If you are overweight and have a high risk for diabetes, consider being tested at a younger age or more often. Preventing infection Hepatitis B  If you have a higher risk for hepatitis B, you should be screened for this virus. You are considered at high risk for hepatitis B if:  You were born in a country where hepatitis B is common. Ask your health care provider which countries are considered high risk.  Your parents were born in a high-risk country, and you have not been immunized against hepatitis B (hepatitis B vaccine).  You have HIV or AIDS.  You use needles to inject street drugs.  You live with someone who has hepatitis B.  You have had sex with someone who has hepatitis B.  You get hemodialysis treatment.  You take certain medicines for conditions, including cancer, organ transplantation, and autoimmune conditions. Hepatitis C  Blood testing is recommended for:  Everyone born from [birth year] through 1963.  Anyone with known risk factors for hepatitis C. Sexually transmitted infections (STIs)  You should be screened for sexually transmitted infections (STIs) including gonorrhea and chlamydia if:  You are sexually active and are younger than 34 years of age.  You are older than 34 years of age and your health care provider tells you that you are at risk for this type of infection.  Your sexual activity has changed since you were last screened and you are at an increased risk for chlamydia or gonorrhea. Ask your health care provider if you are at risk.  If you do not have HIV, but are at risk, it may be recommended that you take a prescription medicine daily to prevent HIV infection. This is called pre-exposure prophylaxis (PrEP). You are considered at risk if:  You are sexually active and do not regularly use condoms or know the HIV status of your partner(s).  You take drugs by injection.  You are sexually active with a partner  who has HIV. Talk with your health care provider about whether you are at high risk of being infected with HIV. If you choose to begin PrEP, you should first be tested for HIV. You should then be tested every 3 months for as long as you are taking PrEP. Pregnancy  If you are premenopausal and you may become pregnant, ask your health care provider about preconception counseling.  If you may become pregnant, take 400 to 800 micrograms (mcg) of folic acid  every day.  If you want to prevent pregnancy, talk to your health care provider about birth control (contraception). Osteoporosis and menopause  Osteoporosis is a disease in which the bones lose minerals and strength with aging. This can result in serious bone fractures. Your risk for osteoporosis can be identified using a bone density scan.  If you are 4 years of age or older, or if you are at risk for osteoporosis and fractures, ask your health care provider if you should be screened.  Ask your health care provider whether you should take a calcium or vitamin D supplement to lower your risk for osteoporosis.  Menopause may have certain physical symptoms and risks.  Hormone replacement therapy may reduce some of these symptoms and risks. Talk to your health care provider about whether hormone replacement therapy is right for you. Follow these instructions at home:  Schedule regular health, dental, and eye exams.  Stay current with your immunizations.  Do not use any tobacco products including cigarettes, chewing tobacco, or electronic cigarettes.  If you are pregnant, do not drink alcohol.  If you are breastfeeding, limit how much and how often you drink alcohol.  Limit alcohol intake to no more than 1 drink per day for nonpregnant women. One drink equals 12 ounces of beer, 5 ounces of wine, or 1 ounces of hard liquor.  Do not use street drugs.  Do not share needles.  Ask your health care provider for help if you need support  or information about quitting drugs.  Tell your health care provider if you often feel depressed.  Tell your health care provider if you have ever been abused or do not feel safe at home. This information is not intended to replace advice given to you by your health care provider. Make sure you discuss any questions you have with your health care provider. Document Released: 06/12/2011 Document Revised: 05/04/2016 Document Reviewed: 08/31/2015 Elsevier Interactive Patient Education  2017 [employer].

## 2017-02-26 NOTE — Progress Notes (Signed)
Subjective:    Patient ID: Kaitlyn Kennedy, female    DOB: [birth year], 36 y.o.   MRN: [id number]  HPI  Pt presents to the clinic today for her annual exam.  Flu: 09/2016 Tetanus: < 10 years Pap Smear: 10/2013 Dentist: annually  Diet: She does eat meat. She consumes fruits and veggies daily. She does eat some fried food. She drinks mostly sweet tea or juice. Exercise: She walks 4 miles 3 x week.  Review of Systems      Past Medical History:  Diagnosis Date  . No pertinent past medical history     Current Outpatient Prescriptions  Medication Sig Dispense Refill  . cyclobenzaprine (FLEXERIL) 5 MG tablet Take 1 tablet (5 mg total) by mouth 3 (three) times daily as needed for muscle spasms. 60 tablet 1  . ibuprofen (ADVIL,MOTRIN) 800 MG tablet Take 1 tablet (800 mg total) by mouth every 8 (eight) hours as needed. 30 tablet 1  . JUNEL 1.5/30 1.5-30 MG-MCG tablet Take 1 tablet by mouth daily.     Marland Kitchen ketoconazole (NIZORAL) 200 MG tablet Take 0.5 tablets (100 mg total) by mouth daily. 30 tablet 5   No current facility-administered medications for this visit.     No Known Allergies  Family History  Problem Relation Age of Onset  . Hypertension Mother   . Hyperlipidemia Mother   . Cancer Maternal Grandmother   . Cancer Maternal Grandfather   . Cancer Paternal Grandmother   . Asthma Paternal Grandfather   . Emphysema Paternal Grandfather     Social History   Social History  . Marital status: Married    Spouse name: N/A  . Number of children: N/A  . Years of education: N/A   Occupational History  . Not on file.   Social History Main Topics  . Smoking status: Former Games developer  . Smokeless tobacco: Never Used  . Alcohol use No  . Drug use: No  . Sexual activity: Yes    Partners: Male    Birth control/ protection: None   Other Topics Concern  . Not on file   Social History Narrative  . No narrative on file     Constitutional: Denies fever, malaise, fatigue,  headache or abrupt weight changes.  HEENT: Pt reports decreased hearing out of the left ear. Denies eye pain, eye redness, ear pain, ringing in the ears, wax buildup, runny nose, nasal congestion, bloody nose, or sore throat. Respiratory: Denies difficulty breathing, shortness of breath, cough or sputum production.   Cardiovascular: Denies chest pain, chest tightness, palpitations or swelling in the hands or feet.  Gastrointestinal: Denies abdominal pain, bloating, constipation, diarrhea or blood in the stool.  GU: Pt reports recurrent yeast infections. Denies urgency, frequency, pain with urination, burning sensation, blood in urine, odor or discharge. Musculoskeletal: Pt reports intermittent back pain. Denies decrease in range of motion, difficulty with gait, or joint pain and swelling.  Skin: Denies redness, rashes, lesions or ulcercations.  Neurological: Denies dizziness, difficulty with memory, difficulty with speech or problems with balance and coordination.  Psych: Denies anxiety, depression, SI/HI.  No other specific complaints in a complete review of systems (except as listed in HPI above).  Objective:   Physical Exam   BP 128/84   Pulse 69   Temp 98.5 F (36.9 C) (Oral)   Ht 5\' 7"  (1.702 m)   Wt 209 lb 4 oz (94.9 kg)   LMP 02/21/2017   SpO2 99%   BMI 32.77 kg/m  Wt  Readings from Last 3 Encounters:  02/26/17 209 lb 4 oz (94.9 kg)  02/06/17 207 lb 12 oz (94.2 kg)  10/23/16 209 lb (94.8 kg)    General: Appears her stated age, obese in NAD. Skin: Warm, dry and intact. HEENT: Head: normal shape and size; Eyes: sclera white, no icterus, conjunctiva pink, PERRLA and EOMs intact; Ears: Tm's gray and intact, normal light reflex; Throat/Mouth: Teeth present, mucosa pink and moist, no exudate, lesions or ulcerations noted.  Neck:  Neck supple, trachea midline. No masses, lumps or thyromegaly present.  Cardiovascular: Normal rate and rhythm. S1,S2 noted.  No murmur, rubs or gallops  noted. No JVD or BLE edema.  Pulmonary/Chest: Normal effort and positive vesicular breath sounds. No respiratory distress. No wheezes, rales or ronchi noted.  Abdomen: Soft and nontender. Normal bowel sounds. No distention or masses noted. Liver, spleen and kidneys non palpable. Breast: Fibrocystic changes noted bilaterally. Musculoskeletal: Strength 5/5 BUE/BLE. No difficulty with gait.  Neurological: Alert and oriented. Cranial nerves II-XII grossly intact. Coordination normal.  Psychiatric: Mood and affect normal. Behavior is normal. Judgment and thought content normal.     BMET    Component Value Date/Time   NA 138 01/27/2016 1236   K 3.7 01/27/2016 1236   CL 105 01/27/2016 1236   CO2 27 01/27/2016 1236   GLUCOSE 76 01/27/2016 1236   BUN 11 01/27/2016 1236   CREATININE 0.72 01/27/2016 1236   CREATININE 0.68 10/28/2013 1706   CALCIUM 9.3 01/27/2016 1236    Lipid Panel     Component Value Date/Time   CHOL 171 01/27/2016 1236   TRIG 80.0 01/27/2016 1236   HDL 45.70 01/27/2016 1236   CHOLHDL 4 01/27/2016 1236   VLDL 16.0 01/27/2016 1236   LDLCALC 109 (H) 01/27/2016 1236    CBC    Component Value Date/Time   WBC 11.3 (H) 01/27/2016 1236   RBC 4.35 01/27/2016 1236   HGB 12.6 01/27/2016 1236   HCT 38.6 01/27/2016 1236   PLT 318.0 01/27/2016 1236   MCV 88.7 01/27/2016 1236   MCH 29.6 10/28/2013 1706   MCHC 32.7 01/27/2016 1236   RDW 13.4 01/27/2016 1236   LYMPHSABS 3.8 01/27/2016 1236   MONOABS 0.8 01/27/2016 1236   EOSABS 0.3 01/27/2016 1236   BASOSABS 0.1 01/27/2016 1236    Hgb A1C No results found for: HGBA1C         Assessment & Plan:   Preventative Health Maintenance:  Flu and tetanus UTD She is past due for pap smear, she will call GYN to schedule Encouraged her to consume a balanced diet and exercise regimen Advised her to see an eye doctor and dentist annually Will check CBC, CMET, Lipid, and A1C today  RTC in 1 year, sooner if needed Nicki ReaperBAITY,  Nitza Schmid, NP

## 2017-02-27 ENCOUNTER — Other Ambulatory Visit: Payer: Self-pay | Admitting: Internal Medicine

## 2017-02-27 DIAGNOSIS — D72829 Elevated white blood cell count, unspecified: Secondary | ICD-10-CM

## 2017-03-14 ENCOUNTER — Other Ambulatory Visit (INDEPENDENT_AMBULATORY_CARE_PROVIDER_SITE_OTHER): Payer: Managed Care, Other (non HMO)

## 2017-03-14 DIAGNOSIS — D72829 Elevated white blood cell count, unspecified: Secondary | ICD-10-CM | POA: Diagnosis not present

## 2017-03-14 LAB — CBC WITH DIFFERENTIAL/PLATELET
BASOS PCT: 0.8 % (ref 0.0–3.0)
Basophils Absolute: 0.1 10*3/uL (ref 0.0–0.1)
EOS PCT: 2.6 % (ref 0.0–5.0)
Eosinophils Absolute: 0.3 10*3/uL (ref 0.0–0.7)
HCT: 37.3 % (ref 36.0–46.0)
Hemoglobin: 12.6 g/dL (ref 12.0–15.0)
LYMPHS ABS: 2.5 10*3/uL (ref 0.7–4.0)
Lymphocytes Relative: 24.7 % (ref 12.0–46.0)
MCHC: 33.6 g/dL (ref 30.0–36.0)
MCV: 90.8 fl (ref 78.0–100.0)
MONO ABS: 0.7 10*3/uL (ref 0.1–1.0)
Monocytes Relative: 7 % (ref 3.0–12.0)
NEUTROS ABS: 6.5 10*3/uL (ref 1.4–7.7)
NEUTROS PCT: 64.9 % (ref 43.0–77.0)
PLATELETS: 316 10*3/uL (ref 150.0–400.0)
RBC: 4.11 Mil/uL (ref 3.87–5.11)
RDW: 13.3 % (ref 11.5–15.5)
WBC: 10 10*3/uL (ref 4.0–10.5)

## 2017-03-20 ENCOUNTER — Other Ambulatory Visit: Payer: Self-pay | Admitting: Internal Medicine

## 2017-03-20 ENCOUNTER — Other Ambulatory Visit (INDEPENDENT_AMBULATORY_CARE_PROVIDER_SITE_OTHER): Payer: Managed Care, Other (non HMO)

## 2017-03-20 DIAGNOSIS — Z113 Encounter for screening for infections with a predominantly sexual mode of transmission: Secondary | ICD-10-CM

## 2017-03-21 LAB — RPR

## 2017-03-21 LAB — GC/CHLAMYDIA PROBE AMP
CT PROBE, AMP APTIMA: NOT DETECTED
GC Probe RNA: NOT DETECTED

## 2017-03-21 LAB — HIV ANTIBODY (ROUTINE TESTING W REFLEX): HIV: NONREACTIVE

## 2017-03-24 LAB — TRICHOMONAS VAGINALIS, PROBE AMP: Trich vag by NAA: NEGATIVE

## 2017-07-16 ENCOUNTER — Ambulatory Visit: Payer: Managed Care, Other (non HMO) | Admitting: Internal Medicine

## 2017-08-20 ENCOUNTER — Encounter: Payer: Self-pay | Admitting: Internal Medicine

## 2017-08-20 ENCOUNTER — Ambulatory Visit (INDEPENDENT_AMBULATORY_CARE_PROVIDER_SITE_OTHER): Payer: 59 | Admitting: Internal Medicine

## 2017-08-20 VITALS — BP 124/70 | HR 83 | Temp 98.9°F | Wt 216.4 lb

## 2017-08-20 DIAGNOSIS — J069 Acute upper respiratory infection, unspecified: Secondary | ICD-10-CM

## 2017-08-20 DIAGNOSIS — B9789 Other viral agents as the cause of diseases classified elsewhere: Secondary | ICD-10-CM

## 2017-08-20 MED ORDER — FLUTICASONE PROPIONATE 50 MCG/ACT NA SUSP: 2.0000 | Freq: Every day | NASAL | 6 refills | Status: DC

## 2017-08-20 MED ORDER — HYDROCODONE-HOMATROPINE 5-1.5 MG/5ML PO SYRP: 5.0000 mL | ORAL_SOLUTION | Freq: Three times a day (TID) | ORAL | 0 refills | Status: DC | PRN

## 2017-08-20 NOTE — Patient Instructions (Signed)
Upper Respiratory Infection, Adult Most upper respiratory infections (URIs) are caused by a virus. A URI affects the nose, throat, and upper air passages. The most common type of URI is often called "the common cold." Follow these instructions at home:  Take medicines only as told by your doctor.  Gargle warm saltwater or take cough drops to comfort your throat as told by your doctor.  Use a warm mist humidifier or inhale steam from a shower to increase air moisture. This may make it easier to breathe.  Drink enough fluid to keep your pee (urine) clear or pale yellow.  Eat soups and other clear broths.  Have a healthy diet.  Rest as needed.  Go back to work when your fever is gone or your doctor says it is okay. ? You may need to stay home longer to avoid giving your URI to others. ? You can also wear a face mask and wash your hands often to prevent spread of the virus.  Use your inhaler more if you have asthma.  Do not use any tobacco products, including cigarettes, chewing tobacco, or electronic cigarettes. If you need help quitting, ask your doctor. Contact a doctor if:  You are getting worse, not better.  Your symptoms are not helped by medicine.  You have chills.  You are getting more short of breath.  You have brown or red mucus.  You have yellow or brown discharge from your nose.  You have pain in your face, especially when you bend forward.  You have a fever.  You have puffy (swollen) neck glands.  You have pain while swallowing.  You have white areas in the back of your throat. Get help right away if:  You have very bad or constant: ? Headache. ? Ear pain. ? Pain in your forehead, behind your eyes, and over your cheekbones (sinus pain). ? Chest pain.  You have long-lasting (chronic) lung disease and any of the following: ? Wheezing. ? Long-lasting cough. ? Coughing up blood. ? A change in your usual mucus.  You have a stiff neck.  You have  changes in your: ? Vision. ? Hearing. ? Thinking. ? Mood. This information is not intended to replace advice given to you by your health care provider. Make sure you discuss any questions you have with your health care provider. Document Released: 05/15/2008 Document Revised: 07/30/2016 Document Reviewed: 03/04/2014 Elsevier Interactive Patient Education  2018 Elsevier Inc.  

## 2017-08-20 NOTE — Progress Notes (Signed)
HPI  Pt presents to the clinic today with c/o headache, fatigue, ear fullness, runny nose, nasal congestion, sore throat and cough. This started 3 days ago. She describes the ear pain as achy, but denies loss of hearing. She is blowing clear mucous out of her nose. She has no difficulty swallowing. The cough is nonproductive. She denies fever or chills but has been fatigue. She has taken Zyrtec and Ibuprofen with minimal relief. She has no history of allergies. She has had sick contacts.  Review of Systems        Past Medical History:  Diagnosis Date  . No pertinent past medical history     Family History  Problem Relation Age of Onset  . Hypertension Mother   . Hyperlipidemia Mother   . Cancer Maternal Grandmother   . Cancer Maternal Grandfather   . Cancer Paternal Grandmother   . Asthma Paternal Grandfather   . Emphysema Paternal Grandfather     Social History   Social History  . Marital status: Married    Spouse name: N/A  . Number of children: N/A  . Years of education: N/A   Occupational History  . Not on file.   Social History Main Topics  . Smoking status: Former Games developermoker  . Smokeless tobacco: Never Used  . Alcohol use No  . Drug use: No  . Sexual activity: Yes    Partners: Male    Birth control/ protection: None   Other Topics Concern  . Not on file   Social History Narrative  . No narrative on file    No Known Allergies   Constitutional: Positive headache, fatigue. Denies fever or abrupt weight changes.  HEENT:  Positive runny nose, nasal congestion, ear fullness, sore throat. Denies eye redness, eye pain, pressure behind the eyes, facial pain, ringing in the ears, wax buildup, or bloody nose. Respiratory: Positive cough. Denies difficulty breathing or shortness of breath.  Cardiovascular: Denies chest pain, chest tightness, palpitations or swelling in the hands or feet.   No other specific complaints in a complete review of systems (except as listed  in HPI above).  Objective:   BP 124/70   Pulse 83   Temp 98.9 F (37.2 C)   Wt 216 lb 6.4 oz (98.2 kg)   SpO2 99%   BMI 33.89 kg/m  Wt Readings from Last 3 Encounters:  08/20/17 216 lb 6.4 oz (98.2 kg)  02/26/17 209 lb 4 oz (94.9 kg)  02/06/17 207 lb 12 oz (94.2 kg)     General: Appears her stated age, in NAD. HEENT: Head: normal shape and size; Ears: Tm's gray and intact, normal light reflex; Nose: mucosa pink and moist, septum midline; Throat/Mouth: + PND. Teeth present, mucosa erythematous and moist, no exudate noted, no lesions or ulcerations noted.  Neck: No cervical lymphadenopathy.  Pulmonary/Chest: Normal effort and positive vesicular breath sounds. No respiratory distress. No wheezes, rales or ronchi noted.       Assessment & Plan:   Viral Upper Respiratory Infection with Cough:  Get some rest and drink plenty of water Do salt water gargles for the sore throat Start Flonase, Continue Zyrtec Rx for Hycodan cough syrup  RTC as needed or if symptoms persist.   Nicki ReaperBAITY, REGINA, NP

## 2017-08-21 ENCOUNTER — Other Ambulatory Visit: Payer: Self-pay | Admitting: Internal Medicine

## 2017-08-21 MED ORDER — PREDNISONE 10 MG PO TABS: ORAL_TABLET | ORAL | 0 refills | Status: DC

## 2017-08-21 MED ORDER — AZITHROMYCIN 250 MG PO TABS: ORAL_TABLET | ORAL | 0 refills | Status: DC

## 2017-08-30 ENCOUNTER — Ambulatory Visit (INDEPENDENT_AMBULATORY_CARE_PROVIDER_SITE_OTHER): Payer: 59 | Admitting: Obstetrics & Gynecology

## 2017-08-30 ENCOUNTER — Encounter: Payer: Self-pay | Admitting: Obstetrics & Gynecology

## 2017-08-30 VITALS — BP 128/71 | HR 89 | Ht 67.5 in | Wt 217.0 lb

## 2017-08-30 DIAGNOSIS — N898 Other specified noninflammatory disorders of vagina: Secondary | ICD-10-CM

## 2017-08-30 DIAGNOSIS — Z113 Encounter for screening for infections with a predominantly sexual mode of transmission: Secondary | ICD-10-CM

## 2017-08-30 DIAGNOSIS — Z01419 Encounter for gynecological examination (general) (routine) without abnormal findings: Secondary | ICD-10-CM | POA: Diagnosis not present

## 2017-08-30 DIAGNOSIS — Z803 Family history of malignant neoplasm of breast: Secondary | ICD-10-CM

## 2017-08-30 DIAGNOSIS — Z124 Encounter for screening for malignant neoplasm of cervix: Secondary | ICD-10-CM

## 2017-08-30 DIAGNOSIS — Z1151 Encounter for screening for human papillomavirus (HPV): Secondary | ICD-10-CM | POA: Diagnosis not present

## 2017-08-30 NOTE — Progress Notes (Signed)
Last Pap 10/28/2013

## 2017-08-30 NOTE — Progress Notes (Signed)
GYNECOLOGY ANNUAL PREVENTATIVE CARE ENCOUNTER NOTE  Subjective:   Kaitlyn Kennedy is a 36 y.o. G60P0102 female here for a routine annual gynecologic exam.  Current complaints: white, non-pruritic vaginal discharge and wants analysis of this. Also desires BRCA testing due to [place] of breat cancer (MGM, MAunt, MCounsin, no first degree relatives).   Denies abnormal vaginal bleeding, discharge, pelvic pain, problems with intercourse or other gynecologic concerns.    Gynecologic History Patient's last menstrual period was 08/07/2017. Contraception: OCP (estrogen/progesterone) Last Pap: 2014. Results were: normal  Obstetric History OB History  Gravida Para Term Preterm AB Living  1 1 0 1 0 2  SAB TAB Ectopic Multiple Live Births  0 0 0 1 2    # Outcome Date GA Lbr Len/2nd Weight Sex Delivery Anes PTL Lv  1A Preterm 11/30/11 19w2d07:05 / 00:32 3 lb 15 oz (1.786 kg) F Vag-Spont EPI  LIV     Birth Comments: no dysmorphic features, voided at delivery; team not notified until after delivery of twin A  1B Preterm 11/30/11 376w2d7:05 / 00:39 3 lb 9.3 oz (1.624 kg) F Vag-Spont EPI  LIV      Past Medical History:  Diagnosis Date  . No pertinent past medical history     Past Surgical History:  Procedure Laterality Date  . HERNIA REPAIR  1987  . INGUINAL HERNIA REPAIR  1985  . VAGINAL DELIVERY  11/30/2011   Procedure: VAGINAL DELIVERY;  Surgeon: ChShelly BombardMD;  [facility];  Service: Gynecology;;    Current Outpatient Prescriptions on File Prior to Visit  Medication Sig Dispense Refill  . cyclobenzaprine (FLEXERIL) 5 MG tablet Take 1 tablet (5 mg total) by mouth 3 (three) times daily as needed for muscle spasms. 90 tablet 3  . fluticasone (FLONASE) 50 MCG/ACT nasal spray Place 2 sprays into both nostrils daily. 16 g 6  . HYDROcodone-homatropine (HYCODAN) 5-1.5 MG/5ML syrup Take 5 mLs by mouth every 8 (eight) hours as needed for cough. 120 mL 0  . ibuprofen (ADVIL,MOTRIN) 800  MG tablet Take 1 tablet (800 mg total) by mouth every 8 (eight) hours as needed. 90 tablet 3  . JUNEL 1.5/30 1.5-30 MG-MCG tablet Take 1 tablet by mouth daily. 3 Package 3  . Omeprazole 20 MG TBEC Take 1 tablet (20 mg total) by mouth daily. 90 each 3   No current facility-administered medications on file prior to visit.     No Known Allergies  Social History   Social History  . Marital status: Married    Spouse name: N/A  . Number of children: N/A  . Years of education: N/A   Occupational History  . Not on file.   Social History Main Topics  . Smoking status: Former SmResearch scientist (life sciences). Smokeless tobacco: Never Used  . Alcohol use No  . Drug use: No  . Sexual activity: Yes    Partners: Male    Birth control/ protection: Pill   Other Topics Concern  . Not on file   Social History Narrative  . No narrative on file    Family History  Problem Relation Age of Onset  . Hypertension Mother   . Hyperlipidemia Mother   . Hypertension Father   . Diabetes Father   . Hyperlipidemia Father   . Breast cancer Maternal Grandmother   . Prostate cancer Maternal Grandfather   . Cervical cancer Paternal Grandmother   . Asthma Paternal Grandfather   . Emphysema Paternal Grandfather  The following portions of the patient's history were reviewed and updated as appropriate: allergies, current medications, past family history, past medical history, past social history, past surgical history and problem list.  Review of Systems Pertinent items noted in HPI and remainder of comprehensive ROS otherwise negative.   Objective:  BP 128/71   Pulse 89   Ht 5' 7.5" (1.715 m)   Wt 217 lb (98.4 kg)   LMP 08/07/2017   BMI 33.49 kg/m  CONSTITUTIONAL: Well-developed, well-nourished female in no acute distress.  HENT:  Normocephalic, atraumatic, External right and left ear normal. Oropharynx is clear and moist EYES: Conjunctivae and EOM are normal. Pupils are equal, round, and reactive to light. No  scleral icterus.  NECK: Normal range of motion, supple, no masses.  Normal thyroid.  SKIN: Skin is warm and dry. No rash noted. Not diaphoretic. No erythema. No pallor. NEUROLOGIC: Alert and oriented to person, place, and time. Normal reflexes, muscle tone coordination. No cranial nerve deficit noted. PSYCHIATRIC: Normal mood and affect. Normal behavior. Normal judgment and thought content. CARDIOVASCULAR: Normal heart rate noted, regular rhythm RESPIRATORY: Clear to auscultation bilaterally. Effort and breath sounds normal, no problems with respiration noted. BREASTS: Symmetric in size. No masses, skin changes, nipple drainage, or lymphadenopathy. ABDOMEN: Soft, normal bowel sounds, no distention noted.  No tenderness, rebound or guarding.  PELVIC: Normal appearing external genitalia; normal appearing vaginal mucosa and cervix.  Scant white discharge noted.  Pap smear obtained.  Normal uterine size, no other palpable masses, no uterine or adnexal tenderness. MUSCULOSKELETAL: Normal range of motion. No tenderness.  No cyanosis, clubbing, or edema.  2+ distal pulses.   Assessment and Plan:  1. Well woman exam Will follow up results of pap smear and STI screen and manage accordingly. - Cytology - PAP - RPR - HIV antibody  2. Vaginal discharge - Cervicovaginal ancillary only  3. FH: breast cancer - BRCAssure Comprehensive Test  Routine preventative health maintenance measures emphasized. Please refer to After Visit Summary for other counseling recommendations.    Verita Schneiders, MD, [place] Attending Obstetrician & Gynecologist, [place] for [facility]

## 2017-08-30 NOTE — Patient Instructions (Signed)
Thank you for enrolling in [facility]. Please follow the instructions below to securely access your online medical record. MyChart allows you to send messages to your doctor, view your test results, manage appointments, and more.   How Do I Sign Up? 1. In your Internet browser, go to [employer] and enter [email or web address]. 2. Click on the Sign Up Now link in the Sign In box. You will see the New Member Sign Up page. 3. Enter your MyChart Access Code exactly as it appears below. You will not need to use this code after you've completed the sign-up process. If you do not sign up before the expiration date, you must request a new code.  MyChart Access Code: [email or web address] Expires: 10/14/2017  1:44 PM  4. Enter your Social Security Number ([id number]) and Date of Birth (mm/dd/yyyy) as indicated and click Submit. You will be taken to the next sign-up page. 5. Create a MyChart ID. This will be your MyChart login ID and cannot be changed, so think of one that is secure and easy to remember. 6. Create a MyChart password. You can change your password at any time. 7. Enter your Password Reset Question and Answer. This can be used at a later time if you forget your password.  8. Enter your e-mail address. You will receive e-mail notification when new information is available in [facility]. 9. Click Sign Up. You can now view your medical record.   Additional Information Remember, MyChart is NOT to be used for urgent needs. For medical emergencies, dial 911.    Preventive Care 18-39 Years, Female Preventive care refers to lifestyle choices and visits with your health care provider that can promote health and wellness. What does preventive care include?  A yearly physical exam. This is also called an annual well check.  Dental exams once or twice a year.  Routine eye exams. Ask your health care provider how often you should have your eyes checked.  Personal lifestyle choices,  including: ? Daily care of your teeth and gums. ? Regular physical activity. ? Eating a healthy diet. ? Avoiding tobacco and drug use. ? Limiting alcohol use. ? Practicing safe sex. ? Taking vitamin and mineral supplements as recommended by your health care provider. What happens during an annual well check? The services and screenings done by your health care provider during your annual well check will depend on your age, overall health, lifestyle risk factors, and family history of disease. Counseling Your health care provider may ask you questions about your:  Alcohol use.  Tobacco use.  Drug use.  Emotional well-being.  Home and relationship well-being.  Sexual activity.  Eating habits.  Work and work Statistician.  Method of birth control.  Menstrual cycle.  Pregnancy history.  Screening You may have the following tests or measurements:  Height, weight, and BMI.  Diabetes screening. This is done by checking your blood sugar (glucose) after you have not eaten for a while (fasting).  Blood pressure.  Lipid and cholesterol levels. These may be checked every 5 years starting at age 83.  Skin check.  Hepatitis C blood test.  Hepatitis B blood test.  Sexually transmitted disease (STD) testing.  BRCA-related cancer screening. This may be done if you have a family history of breast, ovarian, tubal, or peritoneal cancers.  Pelvic exam and Pap test. This may be done every 3 years starting at age 16. Starting at age 60, this may be done every 5 years if you have a Pap test  in combination with an HPV test.  Discuss your test results, treatment options, and if necessary, the need for more tests with your health care provider. Vaccines Your health care provider may recommend certain vaccines, such as:  Influenza vaccine. This is recommended every year.  Tetanus, diphtheria, and acellular pertussis (Tdap, Td) vaccine. You may need a Td booster every 10  years.  Varicella vaccine. You may need this if you have not been vaccinated.  HPV vaccine. If you are 83 or younger, you may need three doses over 6 months.  Measles, mumps, and rubella (MMR) vaccine. You may need at least one dose of MMR. You may also need a second dose.  Pneumococcal 13-valent conjugate (PCV13) vaccine. You may need this if you have certain conditions and were not previously vaccinated.  Pneumococcal polysaccharide (PPSV23) vaccine. You may need one or two doses if you smoke cigarettes or if you have certain conditions.  Meningococcal vaccine. One dose is recommended if you are age 26-21 years and a first-year college student living in a residence hall, or if you have one of several medical conditions. You may also need additional booster doses.  Hepatitis A vaccine. You may need this if you have certain conditions or if you travel or work in places where you may be exposed to hepatitis A.  Hepatitis B vaccine. You may need this if you have certain conditions or if you travel or work in places where you may be exposed to hepatitis B.  Haemophilus influenzae type b (Hib) vaccine. You may need this if you have certain risk factors.  Talk to your health care provider about which screenings and vaccines you need and how often you need them. This information is not intended to replace advice given to you by your health care provider. Make sure you discuss any questions you have with your health care provider. Document Released: 01/23/2002 Document Revised: 08/16/2016 Document Reviewed: 09/28/2015 Elsevier Interactive Patient Education  2017 [employer].

## 2017-08-31 LAB — CYTOLOGY - PAP
Diagnosis: NEGATIVE
HPV (WINDOPATH): NOT DETECTED

## 2017-08-31 LAB — CERVICOVAGINAL ANCILLARY ONLY
BACTERIAL VAGINITIS: NEGATIVE
Candida vaginitis: NEGATIVE
Chlamydia: NEGATIVE
NEISSERIA GONORRHEA: NEGATIVE
Trichomonas: NEGATIVE

## 2017-09-10 ENCOUNTER — Telehealth: Payer: Self-pay

## 2017-09-10 ENCOUNTER — Other Ambulatory Visit: Payer: Self-pay | Admitting: *Deleted

## 2017-09-10 MED ORDER — KETOCONAZOLE 200 MG PO TABS: 100.0000 mg | ORAL_TABLET | Freq: Every day | ORAL | 5 refills | Status: DC

## 2017-09-10 NOTE — Telephone Encounter (Signed)
Attempted to return call, vm not set up.

## 2017-09-11 ENCOUNTER — Other Ambulatory Visit: Payer: Self-pay | Admitting: Internal Medicine

## 2017-09-11 MED ORDER — TERCONAZOLE 0.8 % VA CREA: 1.0000 | TOPICAL_CREAM | Freq: Every day | VAGINAL | 0 refills | Status: DC

## 2017-09-14 ENCOUNTER — Telehealth: Payer: Self-pay

## 2017-09-14 NOTE — Telephone Encounter (Signed)
Advised of lab results and informed that insurance did not approve the [facility] test.

## 2017-10-05 LAB — BRCASSURE COMPREHENSIVE TEST

## 2017-10-05 LAB — RPR: RPR: NONREACTIVE

## 2017-10-05 LAB — HIV ANTIBODY (ROUTINE TESTING W REFLEX): HIV Screen 4th Generation wRfx: NONREACTIVE

## 2017-11-23 ENCOUNTER — Ambulatory Visit (INDEPENDENT_AMBULATORY_CARE_PROVIDER_SITE_OTHER): Payer: 59 | Admitting: Internal Medicine

## 2017-11-23 ENCOUNTER — Encounter: Payer: Self-pay | Admitting: Internal Medicine

## 2017-11-23 ENCOUNTER — Ambulatory Visit: Payer: 59 | Admitting: Family Medicine

## 2017-11-23 ENCOUNTER — Other Ambulatory Visit: Payer: Self-pay | Admitting: Internal Medicine

## 2017-11-23 VITALS — BP 140/84 | HR 94 | Temp 98.5°F | Wt 223.4 lb

## 2017-11-23 DIAGNOSIS — J329 Chronic sinusitis, unspecified: Secondary | ICD-10-CM

## 2017-11-23 DIAGNOSIS — B9789 Other viral agents as the cause of diseases classified elsewhere: Secondary | ICD-10-CM

## 2017-11-23 MED ORDER — PREDNISONE 10 MG PO TABS: ORAL_TABLET | ORAL | 0 refills | Status: DC

## 2017-11-23 NOTE — Patient Instructions (Signed)

## 2017-11-23 NOTE — Progress Notes (Signed)
Subjective:    Patient ID: Kaitlyn Kennedy, female    DOB: [birth year], 36 y.o.   MRN: [id number]  HPI  Pt presents to the clinic today with c/o nasal congestion, ear fullness and cough. This started 3 days ago. She is not blowing anything out of her nose. She does have some slight ear pain that she describes as sharp and stabbing. She denies loss of hearing. The cough is nonproductive. She reports she was running a low grade fever yesterday, but denies chills or body aches. She has tried Ibuprofen and Mucinex with minimal relief. She has had sick contacts.  Review of Systems  Past Medical History:  Diagnosis Date  . No pertinent past medical history     Current Outpatient Medications  Medication Sig Dispense Refill  . cyclobenzaprine (FLEXERIL) 5 MG tablet Take 1 tablet (5 mg total) by mouth 3 (three) times daily as needed for muscle spasms. 90 tablet 3  . fluticasone (FLONASE) 50 MCG/ACT nasal spray Place 2 sprays into both nostrils daily. 16 g 6  . HYDROcodone-homatropine (HYCODAN) 5-1.5 MG/5ML syrup Take 5 mLs by mouth every 8 (eight) hours as needed for cough. 120 mL 0  . ibuprofen (ADVIL,MOTRIN) 800 MG tablet Take 1 tablet (800 mg total) by mouth every 8 (eight) hours as needed. 90 tablet 3  . JUNEL 1.5/30 1.5-30 MG-MCG tablet Take 1 tablet by mouth daily. 3 Package 3  . ketoconazole (NIZORAL) 200 MG tablet Take 0.5 tablets (100 mg total) by mouth daily. 30 tablet 5  . Omeprazole 20 MG TBEC Take 1 tablet (20 mg total) by mouth daily. 90 each 3  . terconazole (TERAZOL 3) 0.8 % vaginal cream Place 1 applicator vaginally at bedtime. 20 g 0   No current facility-administered medications for this visit.     No Known Allergies  Family History  Problem Relation Age of Onset  . Hypertension Mother   . Hyperlipidemia Mother   . Hypertension Father   . Diabetes Father   . Hyperlipidemia Father   . Breast cancer Maternal Grandmother   . Prostate cancer Maternal Grandfather   .  Cervical cancer Paternal Grandmother   . Asthma Paternal Grandfather   . Emphysema Paternal Grandfather     Social History   Socioeconomic History  . Marital status: Married    Spouse name: Not on file  . Number of children: Not on file  . Years of education: Not on file  . Highest education level: Not on file  Social Needs  . Financial resource strain: Not on file  . Food insecurity - worry: Not on file  . Food insecurity - inability: Not on file  . Transportation needs - medical: Not on file  . Transportation needs - non-medical: Not on file  Occupational History  . Not on file  Tobacco Use  . Smoking status: Former Games developermoker  . Smokeless tobacco: Never Used  Substance and Sexual Activity  . Alcohol use: No  . Drug use: No  . Sexual activity: Yes    Partners: Male    Birth control/protection: Pill  Other Topics Concern  . Not on file  Social History Narrative  . Not on file     Constitutional: Pt reports fever. Denies malaise, fatigue, headache or abrupt weight changes.  HEENT: Pt reports nasal congestion and ear pain. Denies eye pain, eye redness, ringing in the ears, wax buildup, runny nose, bloody nose, or sore throat. Respiratory: Pt reports cough. Denies difficulty breathing, shortness of breath,  or sputum production.    No other specific complaints in a complete review of systems (except as listed in HPI above).     Objective:   Physical Exam  There were no vitals taken for this visit. Wt Readings from Last 3 Encounters:  08/30/17 217 lb (98.4 kg)  08/20/17 216 lb 6.4 oz (98.2 kg)  02/26/17 209 lb 4 oz (94.9 kg)    General: Appears her stated age, in NAD. HEENT: Head: normal shape and size, mild maxillary sinus tenderness noted; Ears: Tm's gray and intact, normal light reflex; Nose: mucosa boggy and moist, turbinates swollen; Throat/Mouth: Teeth present, mucosa pink and moist, + PND, no exudate, lesions or ulcerations noted.  Neck:  No adenopathy noted.    Cardiovascular: Normal rate and rhythm.  Pulmonary/Chest: Normal effort and positive vesicular breath sounds. No respiratory distress. No wheezes, rales or ronchi noted.   BMET    Component Value Date/Time   NA 137 02/26/2017 1414   K 3.7 02/26/2017 1414   CL 104 02/26/2017 1414   CO2 30 02/26/2017 1414   GLUCOSE 88 02/26/2017 1414   BUN 9 02/26/2017 1414   CREATININE 0.68 02/26/2017 1414   CREATININE 0.68 10/28/2013 1706   CALCIUM 9.3 02/26/2017 1414    Lipid Panel     Component Value Date/Time   CHOL 175 02/26/2017 1414   TRIG 82.0 02/26/2017 1414   HDL 62.20 02/26/2017 1414   CHOLHDL 3 02/26/2017 1414   VLDL 16.4 02/26/2017 1414   LDLCALC 97 02/26/2017 1414    CBC    Component Value Date/Time   WBC 10.0 03/14/2017 0929   RBC 4.11 03/14/2017 0929   HGB 12.6 03/14/2017 0929   HCT 37.3 03/14/2017 0929   PLT 316.0 03/14/2017 0929   MCV 90.8 03/14/2017 0929   MCH 29.6 10/28/2013 1706   MCHC 33.6 03/14/2017 0929   RDW 13.3 03/14/2017 0929   LYMPHSABS 2.5 03/14/2017 0929   MONOABS 0.7 03/14/2017 0929   EOSABS 0.3 03/14/2017 0929   BASOSABS 0.1 03/14/2017 0929    Hgb A1C Lab Results  Component Value Date   HGBA1C 5.7 02/26/2017            Assessment & Plan:   Viral Sinusitis:  Advised her to start Flonase and Zyrtec OTC eRx for Pred Taper x 6 dayss No indication for abx at this time  Return precautions discussed Nicki ReaperBAITY, Malynda Smolinski, NP

## 2017-12-18 ENCOUNTER — Other Ambulatory Visit: Payer: Self-pay | Admitting: Internal Medicine

## 2017-12-18 MED ORDER — ALPRAZOLAM 0.5 MG PO TABS: 0.5000 mg | ORAL_TABLET | Freq: Every evening | ORAL | 0 refills | Status: DC | PRN

## 2018-01-29 ENCOUNTER — Other Ambulatory Visit: Payer: Self-pay | Admitting: Internal Medicine

## 2018-01-29 MED ORDER — KETOCONAZOLE 200 MG PO TABS: 100.0000 mg | ORAL_TABLET | Freq: Every day | ORAL | 5 refills | Status: DC

## 2018-01-30 ENCOUNTER — Other Ambulatory Visit: Payer: Self-pay | Admitting: Internal Medicine

## 2018-01-30 MED ORDER — TERCONAZOLE 0.8 % VA CREA: 1.0000 | TOPICAL_CREAM | Freq: Every day | VAGINAL | 0 refills | Status: DC

## 2018-02-15 ENCOUNTER — Other Ambulatory Visit: Payer: Self-pay | Admitting: Internal Medicine

## 2018-02-15 NOTE — Telephone Encounter (Signed)
Last CPE 02/2017... Please advise on how many refills

## 2018-03-19 ENCOUNTER — Ambulatory Visit (INDEPENDENT_AMBULATORY_CARE_PROVIDER_SITE_OTHER): Payer: 59 | Admitting: Internal Medicine

## 2018-03-19 ENCOUNTER — Encounter: Payer: Self-pay | Admitting: Internal Medicine

## 2018-03-19 VITALS — BP 124/80 | HR 99 | Temp 98.3°F | Ht 67.0 in | Wt 225.0 lb

## 2018-03-19 DIAGNOSIS — M545 Low back pain, unspecified: Secondary | ICD-10-CM

## 2018-03-19 DIAGNOSIS — Z0000 Encounter for general adult medical examination without abnormal findings: Secondary | ICD-10-CM | POA: Diagnosis not present

## 2018-03-19 DIAGNOSIS — Z113 Encounter for screening for infections with a predominantly sexual mode of transmission: Secondary | ICD-10-CM | POA: Diagnosis not present

## 2018-03-19 DIAGNOSIS — B3731 Acute candidiasis of vulva and vagina: Secondary | ICD-10-CM

## 2018-03-19 DIAGNOSIS — K219 Gastro-esophageal reflux disease without esophagitis: Secondary | ICD-10-CM | POA: Insufficient documentation

## 2018-03-19 DIAGNOSIS — B373 Candidiasis of vulva and vagina: Secondary | ICD-10-CM | POA: Insufficient documentation

## 2018-03-19 DIAGNOSIS — L732 Hidradenitis suppurativa: Secondary | ICD-10-CM | POA: Diagnosis not present

## 2018-03-19 LAB — LIPID PANEL
CHOL/HDL RATIO: 3
Cholesterol: 156 mg/dL (ref 0–200)
HDL: 52.5 mg/dL (ref >39.00)
LDL Cholesterol: 75 mg/dL (ref 0–99)
NONHDL: 103.09
Triglycerides: 138 mg/dL (ref 0.0–149.0)
VLDL: 27.6 mg/dL (ref 0.0–40.0)

## 2018-03-19 LAB — COMPREHENSIVE METABOLIC PANEL
ALT: 15 U/L (ref 0–35)
AST: 17 U/L (ref 0–37)
Albumin: 4.1 g/dL (ref 3.5–5.2)
Alkaline Phosphatase: 48 U/L (ref 39–117)
BUN: 7 mg/dL (ref 6–23)
CHLORIDE: 104 meq/L (ref 96–112)
CO2: 27 mEq/L (ref 19–32)
Calcium: 9 mg/dL (ref 8.4–10.5)
Creatinine, Ser: 0.71 mg/dL (ref 0.40–1.20)
GFR: 120.86 mL/min (ref >60.00)
GLUCOSE: 78 mg/dL (ref 70–99)
POTASSIUM: 4 meq/L (ref 3.5–5.1)
SODIUM: 137 meq/L (ref 135–145)
Total Bilirubin: 0.3 mg/dL (ref 0.2–1.2)
Total Protein: 7 g/dL (ref 6.0–8.3)

## 2018-03-19 LAB — CBC
HEMATOCRIT: 38.9 % (ref 36.0–46.0)
Hemoglobin: 13 g/dL (ref 12.0–15.0)
MCHC: 33.4 g/dL (ref 30.0–36.0)
MCV: 91 fl (ref 78.0–100.0)
Platelets: 319 10*3/uL (ref 150.0–400.0)
RBC: 4.28 Mil/uL (ref 3.87–5.11)
RDW: 12.8 % (ref 11.5–15.5)
WBC: 9.8 10*3/uL (ref 4.0–10.5)

## 2018-03-19 LAB — TSH: TSH: 0.83 u[IU]/mL (ref 0.35–4.50)

## 2018-03-19 LAB — HEMOGLOBIN A1C: Hgb A1c MFr Bld: 5.4 % (ref 4.6–6.5)

## 2018-03-19 MED ORDER — TERCONAZOLE 0.8 % VA CREA: 1.0000 | TOPICAL_CREAM | Freq: Every day | VAGINAL | 0 refills | Status: DC

## 2018-03-19 MED ORDER — OMEPRAZOLE 20 MG PO TBEC: 1.0000 | DELAYED_RELEASE_TABLET | Freq: Every day | ORAL | 3 refills | Status: DC

## 2018-03-19 MED ORDER — KETOCONAZOLE 200 MG PO TABS: 100.0000 mg | ORAL_TABLET | Freq: Every day | ORAL | 5 refills | Status: DC

## 2018-03-19 MED ORDER — NORETHINDRONE ACET-ETHINYL EST 1.5-30 MG-MCG PO TABS: 1.0000 | ORAL_TABLET | Freq: Every day | ORAL | 0 refills | Status: DC

## 2018-03-19 MED ORDER — IBUPROFEN 800 MG PO TABS: 800.0000 mg | ORAL_TABLET | Freq: Three times a day (TID) | ORAL | 3 refills | Status: DC | PRN

## 2018-03-19 NOTE — Assessment & Plan Note (Signed)
Advised her to use warm compresses prn Avoid picking the areas

## 2018-03-19 NOTE — Assessment & Plan Note (Signed)
CBC and CMET today Omeprazole refilled today

## 2018-03-19 NOTE — Patient Instructions (Signed)

## 2018-03-19 NOTE — Progress Notes (Signed)
Subjective:    Patient ID: Kaitlyn Kennedy, female    DOB: [birth year], 37 y.o.   MRN: [id number]  HPI  Pt presents to the clinic today for her annual exam.   She has been experiencing right side low back pain. This started a few weeks ago. It is intermittent. She describes the pain as sore and achy but can be sharp and shooting at times. It seems worse with bending. She has tried Ibuprofen as needed with some relief. She would like a refill of Ibuprofen today.  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  GERD: Controlled on Omeprazole. She denies breakthrough symptoms.  Flu: 09/2017 Tetanus: < 10 years ago Pap Smear: 08/2017 Dentist: annually  Diet: She does eat meat. She consumes fruits and veggies daily. She rarely eats fried foods. She drinks mostly water. Exercise Zumba occassionally  Review of Systems      Past Medical History:  Diagnosis Date  . No pertinent past medical history     Current Outpatient Medications  Medication Sig Dispense Refill  . cyclobenzaprine (FLEXERIL) 5 MG tablet Take 1 tablet (5 mg total) by mouth 3 (three) times daily as needed for muscle spasms. 90 tablet 3  . ibuprofen (ADVIL,MOTRIN) 800 MG tablet Take 1 tablet (800 mg total) by mouth every 8 (eight) hours as needed. 90 tablet 3  . ketoconazole (NIZORAL) 200 MG tablet Take 0.5 tablets (100 mg total) by mouth daily. 30 tablet 5  . Norethindrone Acetate-Ethinyl Estradiol (JUNEL 1.5/30) 1.5-30 MG-MCG tablet Take 1 tablet by mouth daily. 63 tablet 0  . Omeprazole 20 MG TBEC Take 1 tablet (20 mg total) by mouth daily. 90 each 3  . terconazole (TERAZOL 3) 0.8 % vaginal cream Place 1 applicator vaginally at bedtime. 20 g 0   No current facility-administered medications for this visit.     No Known Allergies  Family History  Problem Relation Age of Onset  . Hypertension Mother   . Hyperlipidemia Mother   . Hypertension Father   . Diabetes Father   . Hyperlipidemia  Father   . Breast cancer Maternal Grandmother   . Prostate cancer Maternal Grandfather   . Cervical cancer Paternal Grandmother   . Asthma Paternal Grandfather   . Emphysema Paternal Grandfather     Social History   Socioeconomic History  . Marital status: Married    Spouse name: Not on file  . Number of children: Not on file  . Years of education: Not on file  . Highest education level: Not on file  Occupational History  . Not on file  Social Needs  . Financial resource strain: Not on file  . Food insecurity:    Worry: Not on file    Inability: Not on file  . Transportation needs:    Medical: Not on file    Non-medical: Not on file  Tobacco Use  . Smoking status: Former Games developermoker  . Smokeless tobacco: Never Used  Substance and Sexual Activity  . Alcohol use: No  . Drug use: No  . Sexual activity: Yes    Partners: Male    Birth control/protection: Pill  Lifestyle  . Physical activity:    Days per week: Not on file    Minutes per session: Not on file  . Stress: Not on file  Relationships  . Social connections:    Talks on phone: Not on file    Gets together: Not on file    Attends religious service: Not on file  Active member of club or organization: Not on file    Attends meetings of clubs or organizations: Not on file    Relationship status: Not on file  . Intimate partner violence:    Fear of current or ex partner: Not on file    Emotionally abused: Not on file    Physically abused: Not on file    Forced sexual activity: Not on file  Other Topics Concern  . Not on file  Social History Narrative  . Not on file     Constitutional: Denies fever, malaise, fatigue, headache or abrupt weight changes.  HEENT: Denies eye pain, eye redness, ear pain, ringing in the ears, wax buildup, runny nose, nasal congestion, bloody nose, or sore throat. Respiratory: Denies difficulty breathing, shortness of breath, cough or sputum production.   Cardiovascular: Denies chest  pain, chest tightness, palpitations or swelling in the hands or feet.  Gastrointestinal: Pt reports intermittent reflux. Denies abdominal pain, bloating, constipation, diarrhea or blood in the stool.  GU: Denies urgency, frequency, pain with urination, burning sensation, blood in urine, odor or discharge. Musculoskeletal: Pt reports intermittent low back pain. Denies decrease in range of motion, difficulty with gait, or joint swelling.  Skin: Pt reports cyst under breast. Denies redness, rashes, or ulcercations.  Neurological: Denies dizziness, difficulty with memory, difficulty with speech or problems with balance and coordination.  Psych: Pt reports grief (recent loss of brother). Denies anxiety, depression, SI/HI.  No other specific complaints in a complete review of systems (except as listed in HPI above).  Objective:   Physical Exam   BP 124/80   Pulse 99   Temp 98.3 F (36.8 C) (Oral)   Ht 5\' 7"  (1.702 m)   Wt 225 lb (102.1 kg)   LMP 03/05/2018   SpO2 98%   BMI 35.24 kg/m  Wt Readings from Last 3 Encounters:  03/19/18 225 lb (102.1 kg)  11/23/17 223 lb 6.4 oz (101.3 kg)  08/30/17 217 lb (98.4 kg)    General: Appears her stated age, obese in NAD. Skin: Scarring from previous hydradenitis lesions noted underneath bilateral breast, upper abdomen. Breast symmetrical, without masses. HEENT: Head: normal shape and size; Eyes: sclera white, no icterus, conjunctiva pink, PERRLA and EOMs intact; Ears: Tm's gray and intact, normal light reflex;Throat/Mouth: Teeth present, mucosa pink and moist, no exudate, lesions or ulcerations noted.  Neck:  Neck supple, trachea midline. No masses, lumps or thyromegaly present.  Cardiovascular: Normal rate and rhythm. S1,S2 noted.  No murmur, rubs or gallops noted. No JVD or BLE edema.  Pulmonary/Chest: Normal effort and positive vesicular breath sounds. No respiratory distress. No wheezes, rales or ronchi noted.  Abdomen: Soft and nontender. Normal  bowel sounds. No distention or masses noted. Liver, spleen and kidneys non palpable. Musculoskeletal: Strength 5/5 BUE/BLE. No difficulty with gait.  Neurological: Alert and oriented. Cranial nerves II-XII grossly intact. Coordination normal.  Psychiatric: Mood and affect normal. Behavior is normal. Judgment and thought content normal.     BMET    Component Value Date/Time   NA 137 02/26/2017 1414   K 3.7 02/26/2017 1414   CL 104 02/26/2017 1414   CO2 30 02/26/2017 1414   GLUCOSE 88 02/26/2017 1414   BUN 9 02/26/2017 1414   CREATININE 0.68 02/26/2017 1414   CREATININE 0.68 10/28/2013 1706   CALCIUM 9.3 02/26/2017 1414    Lipid Panel     Component Value Date/Time   CHOL 175 02/26/2017 1414   TRIG 82.0 02/26/2017 1414  HDL 62.20 02/26/2017 1414   CHOLHDL 3 02/26/2017 1414   VLDL 16.4 02/26/2017 1414   LDLCALC 97 02/26/2017 1414    CBC    Component Value Date/Time   WBC 10.0 03/14/2017 0929   RBC 4.11 03/14/2017 0929   HGB 12.6 03/14/2017 0929   HCT 37.3 03/14/2017 0929   PLT 316.0 03/14/2017 0929   MCV 90.8 03/14/2017 0929   MCH 29.6 10/28/2013 1706   MCHC 33.6 03/14/2017 0929   RDW 13.3 03/14/2017 0929   LYMPHSABS 2.5 03/14/2017 0929   MONOABS 0.7 03/14/2017 0929   EOSABS 0.3 03/14/2017 0929   BASOSABS 0.1 03/14/2017 0929    Hgb A1C Lab Results  Component Value Date   HGBA1C 5.7 02/26/2017           Assessment & Plan:   Preventative Health Maintenance:  Encouraged her to get a flu shot in the fall Tetanus UTD Pap smear UTD Encouraged her to consume a balanced diet and exercise regimen Advised her to see an eye doctor and dentist annually Will check CBC, CMET, Lipid, TSH, A1C today  Low Back Pain:  Encouraged stretching Continue Ibuprofen- refilled today  RTC in 1 year, sooner if needed Nicki Reaper, NP

## 2018-03-19 NOTE — Assessment & Plan Note (Signed)
Continue Nizoral daily and Teraconazole topically prn Refilled today CMET today

## 2018-03-19 NOTE — Addendum Note (Signed)
Addended by: Roena MaladyEVONTENNO, Farrell Broerman Y on: 03/19/2018 02:27 PM   Modules accepted: Orders

## 2018-03-19 NOTE — Addendum Note (Signed)
Addended by: Lorre MunroeBAITY, Devean Skoczylas W on: 03/19/2018 02:26 PM   Modules accepted: Orders

## 2018-03-20 LAB — C. TRACHOMATIS/N. GONORRHOEAE RNA
C. TRACHOMATIS RNA, TMA: NOT DETECTED
N. GONORRHOEAE RNA, TMA: NOT DETECTED

## 2018-03-20 NOTE — Addendum Note (Signed)
Addended by: Alvina ChouWALSH, Zylpha Poynor J on: 03/20/2018 04:56 PM   Modules accepted: Orders

## 2018-03-21 NOTE — Addendum Note (Signed)
Addended by: Alvina ChouWALSH, Amandine Covino J on: 03/21/2018 07:27 AM   Modules accepted: Orders

## 2018-03-22 MED ORDER — OMEPRAZOLE 20 MG PO CPDR: 20.0000 mg | DELAYED_RELEASE_CAPSULE | Freq: Every day | ORAL | 2 refills | Status: DC

## 2018-03-22 NOTE — Addendum Note (Signed)
Addended by: Roena MaladyEVONTENNO, Aslynn Brunetti Y on: 03/22/2018 02:51 PM   Modules accepted: Orders

## 2018-03-28 ENCOUNTER — Other Ambulatory Visit: Payer: Self-pay | Admitting: *Deleted

## 2018-03-28 MED ORDER — CETIRIZINE HCL 10 MG PO TABS: 10.0000 mg | ORAL_TABLET | Freq: Every day | ORAL | 2 refills | Status: DC

## 2018-04-18 ENCOUNTER — Other Ambulatory Visit: Payer: 59

## 2018-04-18 DIAGNOSIS — Z0184 Encounter for antibody response examination: Secondary | ICD-10-CM

## 2018-04-19 LAB — MEASLES/MUMPS/RUBELLA IMMUNITY
Mumps IgG: 86.6 AU/mL
RUBEOLA IGG: 136 [AU]/ml
Rubella: 2.26 index

## 2018-05-17 ENCOUNTER — Other Ambulatory Visit: Payer: Self-pay | Admitting: *Deleted

## 2018-05-17 MED ORDER — TERCONAZOLE 0.8 % VA CREA: 1.0000 | TOPICAL_CREAM | Freq: Every day | VAGINAL | 3 refills | Status: DC

## 2018-05-29 ENCOUNTER — Other Ambulatory Visit: Payer: Self-pay

## 2018-05-29 MED ORDER — NORETHINDRONE ACET-ETHINYL EST 1.5-30 MG-MCG PO TABS: 1.0000 | ORAL_TABLET | Freq: Every day | ORAL | 3 refills | Status: DC

## 2018-05-29 NOTE — Telephone Encounter (Signed)
Pt request refill Junel; CPX 03/2018. Refilled per protocol to [facility].

## 2018-07-05 ENCOUNTER — Ambulatory Visit (INDEPENDENT_AMBULATORY_CARE_PROVIDER_SITE_OTHER): Payer: 59 | Admitting: Internal Medicine

## 2018-07-05 ENCOUNTER — Encounter: Payer: Self-pay | Admitting: Internal Medicine

## 2018-07-05 VITALS — BP 124/78 | HR 82 | Temp 98.2°F | Wt 228.0 lb

## 2018-07-05 DIAGNOSIS — R252 Cramp and spasm: Secondary | ICD-10-CM

## 2018-07-05 LAB — COMPREHENSIVE METABOLIC PANEL
ALBUMIN: 4.1 g/dL (ref 3.5–5.2)
ALT: 13 U/L (ref 0–35)
AST: 16 U/L (ref 0–37)
Alkaline Phosphatase: 45 U/L (ref 39–117)
BILIRUBIN TOTAL: 0.5 mg/dL (ref 0.2–1.2)
BUN: 9 mg/dL (ref 6–23)
CALCIUM: 9.2 mg/dL (ref 8.4–10.5)
CO2: 27 meq/L (ref 19–32)
CREATININE: 0.8 mg/dL (ref 0.40–1.20)
Chloride: 104 mEq/L (ref 96–112)
GFR: 105.13 mL/min (ref >60.00)
Glucose, Bld: 96 mg/dL (ref 70–99)
Potassium: 4 mEq/L (ref 3.5–5.1)
SODIUM: 137 meq/L (ref 135–145)
Total Protein: 7.4 g/dL (ref 6.0–8.3)

## 2018-07-05 LAB — TSH: TSH: 0.75 u[IU]/mL (ref 0.35–4.50)

## 2018-07-05 LAB — MAGNESIUM: MAGNESIUM: 1.9 mg/dL (ref 1.5–2.5)

## 2018-07-05 NOTE — Patient Instructions (Signed)

## 2018-07-05 NOTE — Progress Notes (Signed)
Subjective:    Patient ID: Kaitlyn Kennedy, female    DOB: 01-01-1981, 37 y.o.   MRN: [id number]  HPI  Pt presents to the clinic today with c/o muscle cramps in her upper thighs. She reports this has been intermittent over the last several weeks. It only seems to bother her at night. The pain does not radiate. She denies back pain, calf pain, numbness, tingling or weakness. She reports she will try to get up, walk/stretch. She has tried drinking water, Gatorade, eating mustard. She has also tried Ibuprofen and a muscle relaxer with minimal relief.   Review of Systems      Past Medical History:  Diagnosis Date  . No pertinent past medical history     Current Outpatient Medications  Medication Sig Dispense Refill  . cetirizine (ZYRTEC) 10 MG tablet Take 1 tablet (10 mg total) by mouth daily. 90 tablet 2  . cyclobenzaprine (FLEXERIL) 5 MG tablet Take 1 tablet (5 mg total) by mouth 3 (three) times daily as needed for muscle spasms. 90 tablet 3  . ibuprofen (ADVIL,MOTRIN) 800 MG tablet Take 1 tablet (800 mg total) by mouth every 8 (eight) hours as needed. 90 tablet 3  . ketoconazole (NIZORAL) 200 MG tablet Take 0.5 tablets (100 mg total) by mouth daily. 30 tablet 5  . Norethindrone Acetate-Ethinyl Estradiol (JUNEL 1.5/30) 1.5-30 MG-MCG tablet Take 1 tablet by mouth daily. 63 tablet 3  . omeprazole (PRILOSEC) 20 MG capsule Take 1 capsule (20 mg total) by mouth daily. 90 capsule 2  . terconazole (TERAZOL 3) 0.8 % vaginal cream Place 1 applicator vaginally at bedtime. 20 g 3   No current facility-administered medications for this visit.     No Known Allergies  Family History  Problem Relation Age of Onset  . Hypertension Mother   . Hyperlipidemia Mother   . Hypertension Father   . Diabetes Father   . Hyperlipidemia Father   . Breast cancer Maternal Grandmother   . Prostate cancer Maternal Grandfather   . Cervical cancer Paternal Grandmother   . Asthma Paternal Grandfather   .  Emphysema Paternal Grandfather     Social History   Socioeconomic History  . Marital status: Married    Spouse name: Not on file  . Number of children: Not on file  . Years of education: Not on file  . Highest education level: Not on file  Occupational History  . Not on file  Social Needs  . Financial resource strain: Not on file  . Food insecurity:    Worry: Not on file    Inability: Not on file  . Transportation needs:    Medical: Not on file    Non-medical: Not on file  Tobacco Use  . Smoking status: Former Games developer  . Smokeless tobacco: Never Used  Substance and Sexual Activity  . Alcohol use: No  . Drug use: No  . Sexual activity: Yes    Partners: Male    Birth control/protection: Pill  Lifestyle  . Physical activity:    Days per week: Not on file    Minutes per session: Not on file  . Stress: Not on file  Relationships  . Social connections:    Talks on phone: Not on file    Gets together: Not on file    Attends religious service: Not on file    Active member of club or organization: Not on file    Attends meetings of clubs or organizations: Not on file  Relationship status: Not on file  . Intimate partner violence:    Fear of current or ex partner: Not on file    Emotionally abused: Not on file    Physically abused: Not on file    Forced sexual activity: Not on file  Other Topics Concern  . Not on file  Social History Narrative  . Not on file     Constitutional: Denies fever, malaise, fatigue, headache or abrupt weight changes.  Musculoskeletal: Pt reports muscle pain in legs. Denies decrease in range of motion, difficulty with gait, or joint pain and swelling.  Skin: Denies redness, rashes, lesions or ulcercations.   No other specific complaints in a complete review of systems (except as listed in HPI above).  Objective:   Physical Exam  BP 124/78   Pulse 82   Temp 98.2 F (36.8 C) (Oral)   Wt 228 lb (103.4 kg)   SpO2 98%   BMI 35.71 kg/m   Wt Readings from Last 3 Encounters:  07/05/18 228 lb (103.4 kg)  03/19/18 225 lb (102.1 kg)  11/23/17 223 lb 6.4 oz (101.3 kg)    General: Appears her stated age, obese in NAD. Skin: Warm, dry and intact. No rashes, lesions or ulcerations noted. Musculoskeletal: Normal flexion, extension and rotation of the spine. Normal flexion, extension, adduction and abduction of the hips. No pain with palpation of bilateral hips. No lower extremity edema noted. Strength 5/5 BLE. No difficulty with gait.  Neurological: Alert and oriented.    BMET    Component Value Date/Time   NA 137 03/19/2018 1302   K 4.0 03/19/2018 1302   CL 104 03/19/2018 1302   CO2 27 03/19/2018 1302   GLUCOSE 78 03/19/2018 1302   BUN 7 03/19/2018 1302   CREATININE 0.71 03/19/2018 1302   CREATININE 0.68 10/28/2013 1706   CALCIUM 9.0 03/19/2018 1302    Lipid Panel     Component Value Date/Time   CHOL 156 03/19/2018 1302   TRIG 138.0 03/19/2018 1302   HDL 52.50 03/19/2018 1302   CHOLHDL 3 03/19/2018 1302   VLDL 27.6 03/19/2018 1302   LDLCALC 75 03/19/2018 1302    CBC    Component Value Date/Time   WBC 9.8 03/19/2018 1302   RBC 4.28 03/19/2018 1302   HGB 13.0 03/19/2018 1302   HCT 38.9 03/19/2018 1302   PLT 319.0 03/19/2018 1302   MCV 91.0 03/19/2018 1302   MCH 29.6 10/28/2013 1706   MCHC 33.4 03/19/2018 1302   RDW 12.8 03/19/2018 1302   LYMPHSABS 2.5 03/14/2017 0929   MONOABS 0.7 03/14/2017 0929   EOSABS 0.3 03/14/2017 0929   BASOSABS 0.1 03/14/2017 0929    Hgb A1C Lab Results  Component Value Date   HGBA1C 5.4 03/19/2018            Assessment & Plan:   Nocturnal Muscle Cramps:  Encouraged adequate water and electrolyte intake Can sit in a hot back to help relax muscles when this occurs Encouraged stretching, massage may be helpful Will check CMET, Mg and TSH today  Will follow up after labs are back, return precautions discussed Nicki Reaperegina Baity, NP

## 2018-10-03 ENCOUNTER — Other Ambulatory Visit: Payer: Self-pay | Admitting: Internal Medicine

## 2018-10-03 MED ORDER — SULFAMETHOXAZOLE-TRIMETHOPRIM 800-160 MG PO TABS: 1.0000 | ORAL_TABLET | Freq: Two times a day (BID) | ORAL | 0 refills | Status: DC

## 2018-10-03 MED ORDER — PHENTERMINE HCL 37.5 MG PO CAPS: 37.5000 mg | ORAL_CAPSULE | ORAL | 2 refills | Status: DC

## 2019-01-07 ENCOUNTER — Other Ambulatory Visit: Payer: Self-pay | Admitting: Internal Medicine

## 2019-01-07 MED ORDER — ALPRAZOLAM 0.25 MG PO TABS: 0.2500 mg | ORAL_TABLET | Freq: Two times a day (BID) | ORAL | 0 refills | Status: DC | PRN

## 2019-01-16 ENCOUNTER — Encounter: Payer: Self-pay | Admitting: Internal Medicine

## 2019-01-16 ENCOUNTER — Ambulatory Visit (INDEPENDENT_AMBULATORY_CARE_PROVIDER_SITE_OTHER): Payer: 59 | Admitting: Internal Medicine

## 2019-01-16 VITALS — BP 118/68 | HR 87 | Temp 101.1°F | Resp 20

## 2019-01-16 DIAGNOSIS — R0981 Nasal congestion: Secondary | ICD-10-CM | POA: Diagnosis not present

## 2019-01-16 DIAGNOSIS — R05 Cough: Secondary | ICD-10-CM

## 2019-01-16 DIAGNOSIS — J101 Influenza due to other identified influenza virus with other respiratory manifestations: Secondary | ICD-10-CM

## 2019-01-16 DIAGNOSIS — R5081 Fever presenting with conditions classified elsewhere: Secondary | ICD-10-CM | POA: Diagnosis not present

## 2019-01-16 DIAGNOSIS — R059 Cough, unspecified: Secondary | ICD-10-CM

## 2019-01-16 LAB — POC INFLUENZA A&B (BINAX/QUICKVUE)
Influenza A, POC: NEGATIVE
Influenza B, POC: POSITIVE — AB

## 2019-01-16 NOTE — Patient Instructions (Signed)

## 2019-01-16 NOTE — Progress Notes (Signed)
HPI  Pt presents to the clinic today with c/o headache, nasal congestion and cough. She reports this started 4 days ago. She is blowing clear mucous out of her nose. The cough is non productive. She has run fever, had chills and body aches. She has taken cough syrup OTC. She has had sick contacts.  Review of Systems      Past Medical History:  Diagnosis Date  . No pertinent past medical history     Family History  Problem Relation Age of Onset  . Hypertension Mother   . Hyperlipidemia Mother   . Hypertension Father   . Diabetes Father   . Hyperlipidemia Father   . Breast cancer Maternal Grandmother   . Prostate cancer Maternal Grandfather   . Cervical cancer Paternal Grandmother   . Asthma Paternal Grandfather   . Emphysema Paternal Grandfather     Social History   Socioeconomic History  . Marital status: Married    Spouse name: Not on file  . Number of children: Not on file  . Years of education: Not on file  . Highest education level: Not on file  Occupational History  . Not on file  Social Needs  . Financial resource strain: Not on file  . Food insecurity:    Worry: Not on file    Inability: Not on file  . Transportation needs:    Medical: Not on file    Non-medical: Not on file  Tobacco Use  . Smoking status: Former Games developermoker  . Smokeless tobacco: Never Used  Substance and Sexual Activity  . Alcohol use: No  . Drug use: No  . Sexual activity: Yes    Partners: Male    Birth control/protection: Pill  Lifestyle  . Physical activity:    Days per week: Not on file    Minutes per session: Not on file  . Stress: Not on file  Relationships  . Social connections:    Talks on phone: Not on file    Gets together: Not on file    Attends religious service: Not on file    Active member of club or organization: Not on file    Attends meetings of clubs or organizations: Not on file    Relationship status: Not on file  . Intimate partner violence:    Fear of  current or ex partner: Not on file    Emotionally abused: Not on file    Physically abused: Not on file    Forced sexual activity: Not on file  Other Topics Concern  . Not on file  Social History Narrative  . Not on file    No Known Allergies   Constitutional: Positive fatigue and fever. Denies headache, abrupt weight changes.  HEENT:  Positive nasal congestion. Denies eye redness, eye pain, pressure behind the eyes, facial pain, ear pain, ringing in the ears, wax buildup, runny nose or sore throat. Respiratory: Positive cough. Denies difficulty breathing or shortness of breath.  Cardiovascular: Denies chest pain, chest tightness, palpitations or swelling in the hands or feet.   No other specific complaints in a complete review of systems (except as listed in HPI above).  Objective:   118/68 87 20 Wt Readings from Last 3 Encounters:  07/05/18 228 lb (103.4 kg)  03/19/18 225 lb (102.1 kg)  11/23/17 223 lb 6.4 oz (101.3 kg)     General: Appears her stated age, ill appearing, in NAD. HEENT: Head: normal shape and size, no sinus tenderness noted; Ears: Tm's gray  and intact, normal light reflex; Nose: mucosa pink and moist, septum midline; Throat/Mouth: + PND. Teeth present, mucosa erythematous and moist, no exudate noted, no lesions or ulcerations noted.  Neck: No cervical lymphadenopathy.  Cardiovascular: Normal rate and rhythm. S1,S2 noted.  No murmur, rubs or gallops noted.  Pulmonary/Chest: Normal effort and positive vesicular breath sounds. No respiratory distress. No wheezes, rales or ronchi noted.       Assessment & Plan:   Nasal Congestion, Cough, Fever:  Rapid flu: positive for B Get some rest and drink plenty of water Start Flonase OTC Too far out for Tamiflu Delsym as needed for cough  RTC as needed or if symptoms persist.   Nicki Reaper, NP

## 2019-01-17 NOTE — Addendum Note (Signed)
Addended by: Roena MaladyEVONTENNO, Jekhi Bolin Y on: 01/17/2019 04:34 PM   Modules accepted: Orders

## 2019-02-17 ENCOUNTER — Other Ambulatory Visit: Payer: Self-pay | Admitting: Internal Medicine

## 2019-02-17 MED ORDER — FLUTICASONE PROPIONATE 50 MCG/ACT NA SUSP: 2.0000 | Freq: Every day | NASAL | 6 refills | Status: DC

## 2019-02-18 ENCOUNTER — Other Ambulatory Visit: Payer: Self-pay | Admitting: Internal Medicine

## 2019-04-23 ENCOUNTER — Other Ambulatory Visit: Payer: Self-pay

## 2019-04-23 MED ORDER — NORETHINDRONE ACET-ETHINYL EST 1.5-30 MG-MCG PO TABS: 1.0000 | ORAL_TABLET | Freq: Every day | ORAL | 3 refills | Status: DC

## 2019-05-06 ENCOUNTER — Other Ambulatory Visit: Payer: Self-pay | Admitting: Internal Medicine

## 2019-05-06 MED ORDER — ALBUTEROL SULFATE HFA 108 (90 BASE) MCG/ACT IN AERS: 1.0000 | INHALATION_SPRAY | Freq: Four times a day (QID) | RESPIRATORY_TRACT | 2 refills | Status: DC | PRN

## 2019-06-05 ENCOUNTER — Other Ambulatory Visit: Payer: Self-pay | Admitting: Internal Medicine

## 2019-08-07 ENCOUNTER — Other Ambulatory Visit: Payer: Self-pay | Admitting: Internal Medicine

## 2019-08-07 MED ORDER — SULFAMETHOXAZOLE-TRIMETHOPRIM 800-160 MG PO TABS: 1.0000 | ORAL_TABLET | Freq: Two times a day (BID) | ORAL | 0 refills | Status: DC

## 2019-09-23 ENCOUNTER — Encounter: Payer: Self-pay | Admitting: Family Medicine

## 2019-09-23 ENCOUNTER — Other Ambulatory Visit: Payer: Self-pay

## 2019-09-23 ENCOUNTER — Ambulatory Visit: Payer: 59 | Admitting: Family Medicine

## 2019-09-23 DIAGNOSIS — S2001XA Contusion of right breast, initial encounter: Secondary | ICD-10-CM

## 2019-09-23 DIAGNOSIS — W19XXXA Unspecified fall, initial encounter: Secondary | ICD-10-CM | POA: Diagnosis not present

## 2019-09-23 NOTE — Assessment & Plan Note (Signed)
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 If related  Disc ways to improve balance  Wt loss and general fitness as well  inst to watch for new symptoms/neurologic Also consider f/u with ENT if symptoms continue Also enc her to set up wellness labs and visit with PCP

## 2019-09-23 NOTE — Patient Instructions (Signed)
Watch for headache and dizziness Make sure to drink enough water  Keep exercising  Think about adding some beginning yoga  Let's consider ENT visit in the future if this happens again   Keep me posted  Get wellness labs and a health mt exam   Keep working on weight loss

## 2019-09-23 NOTE — Progress Notes (Signed)
Subjective:    Patient ID: Kaitlyn Kennedy, female    DOB: April 08, 1981, 38 y.o.   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  It may affect her equilibrium      Vision is changing a bit  - may need new glasses  Is due for an eye exam   No thirst or urination  She does sweat easily  Falls asleep easily -when she sits around  Not tired -until she sits down   Has been trying to loose wt  She snores when she lies on back   Has always been clumsy  Walks every day for exercise  Has never taken yoga   Does zumba   Her mother is worried about her   Sees Nicki Reaperegina Baity for pcp  Due for health mt exam   She has been staying home with kids- helping with online school    BP Readings from Last 3 Encounters:  09/23/19 (!) 144/78  01/16/19 118/68  07/05/18 124/78   Pulse Readings from Last 3 Encounters:  09/23/19 (!) 107  01/16/19 87  07/05/18 82  bp improved on 2nd check  BP: 120/80   Patient Active Problem List   Diagnosis Date Noted  . Fall 09/23/2019  . GERD (gastroesophageal reflux disease) 03/19/2018  . Yeast infection involving the vagina and surrounding area 03/19/2018  . Hydradenitis 03/19/2018   Past Medical History:  Diagnosis Date  . No pertinent past medical history    Past Surgical History:  Procedure Laterality Date  . HERNIA REPAIR  1987  . INGUINAL HERNIA REPAIR  1985  . VAGINAL DELIVERY   11/30/2011   Procedure: VAGINAL DELIVERY;  Surgeon: Brock Badharles A Harper, MD;  Location: [facility];  Service: Gynecology;;   Social History   Tobacco Use  . Smoking status: Former Games developermoker  . Smokeless tobacco: Never Used  Substance Use Topics  . Alcohol use: No  . Drug use: No   Family History  Problem Relation Age of Onset  . Hypertension Mother   . Hyperlipidemia Mother   . Hypertension Father   . Diabetes Father   . Hyperlipidemia Father   . Breast cancer Maternal Grandmother   . Prostate cancer Maternal Grandfather   . Cervical cancer Paternal Grandmother   . Asthma Paternal Grandfather   . Emphysema Paternal Grandfather    No Known Allergies Current Outpatient Medications on File Prior to Visit  Medication Sig Dispense Refill  . albuterol (VENTOLIN HFA) 108 (90 Base) MCG/ACT inhaler Inhale 1 puff into the lungs every 6 (six) hours as needed for wheezing or shortness of breath. 1 Inhaler 2  . cetirizine (ZYRTEC) 10 MG tablet Take 1 tablet (10 mg total) by mouth daily. 90 tablet 2  . fluticasone (FLONASE) 50 MCG/ACT nasal spray Place 2 sprays into both nostrils  daily. 16 g 6  . ibuprofen (ADVIL) 800 MG tablet TAKE 1 TABLET BY MOUTH EVERY 8 HOURS AS NEEDED 90 tablet 0  . ketoconazole (NIZORAL) 200 MG tablet Take 0.5 tablets (100 mg total) by mouth daily. 30 tablet 5  . Norethindrone Acetate-Ethinyl Estradiol (JUNEL 1.5/30) 1.5-30 MG-MCG tablet Take 1 tablet by mouth daily. 63 tablet 3  . omeprazole (PRILOSEC) 20 MG capsule TAKE 1 CAPSULE BY MOUTH EVERY DAY 90 capsule 1   No current facility-administered medications on file prior to visit.     Review of Systems  Constitutional: Positive for fatigue. Negative for activity change, appetite change, fever and unexpected weight change.  HENT: Negative for congestion, ear pain, rhinorrhea, sinus pressure and sore throat.   Eyes: Negative for pain, redness and visual disturbance.  Respiratory: Negative for cough, shortness of breath and  wheezing.   Cardiovascular: Negative for chest pain and palpitations.  Gastrointestinal: Negative for abdominal pain, blood in stool, constipation and diarrhea.  Endocrine: Negative for polydipsia and polyuria.  Genitourinary: Negative for dysuria, frequency and urgency.  Musculoskeletal: Negative for arthralgias, back pain and myalgias.  Skin: Negative for pallor and rash.  Allergic/Immunologic: Negative for environmental allergies.  Neurological: Negative for dizziness, tremors, seizures, syncope, facial asymmetry, speech difficulty, weakness, light-headedness, numbness and headaches.       Several falls Poor balance at times   Hematological: Negative for adenopathy. Does not bruise/bleed easily.  Psychiatric/Behavioral: Negative for decreased concentration and dysphoric mood. The patient is not nervous/anxious.        Objective:   Physical Exam Constitutional:      General: She is not in acute distress.    Appearance: Normal appearance. She is well-developed. She is obese. She is not ill-appearing or diaphoretic.  HENT:     Head: Normocephalic and atraumatic.     Right Ear: Tympanic membrane, ear canal and external ear normal.     Left Ear: Tympanic membrane, ear canal and external ear normal.     Nose: Nose normal.     Mouth/Throat:     Mouth: Mucous membranes are moist.     Pharynx: Oropharynx is clear. No oropharyngeal exudate.  Eyes:     General: No scleral icterus.       Right eye: No discharge.        Left eye: No discharge.     Conjunctiva/sclera: Conjunctivae normal.     Pupils: Pupils are equal, round, and reactive to light.     Comments: No nystagmus   Neck:     Musculoskeletal: Full passive range of motion without pain, normal range of motion and neck supple. No muscular tenderness.     Thyroid: No thyromegaly.     Vascular: No carotid bruit or JVD.     Trachea: No tracheal deviation.  Cardiovascular:     Rate and Rhythm: Regular rhythm. Tachycardia present.      Heart sounds: Normal heart sounds. No murmur.     Comments: HR comes down after sitting Pulmonary:     Effort: Pulmonary effort is normal. No respiratory distress.     Breath sounds: Normal breath sounds. No stridor. No wheezing or rales.  Abdominal:     General: Bowel sounds are normal. There is no distension.     Palpations: Abdomen is soft. There is no mass.     Tenderness: There is no abdominal tenderness. There is no guarding or rebound.  Musculoskeletal:        General: No tenderness.  Right lower leg: No edema.     Left lower leg: No edema.  Lymphadenopathy:     Cervical: No cervical adenopathy.  Skin:    General: Skin is warm and dry.     Coloration: Skin is not pale.     Findings: No erythema or rash.  Neurological:     Mental Status: She is alert and oriented to person, place, and time.     Cranial Nerves: Cranial nerves are intact. No cranial nerve deficit, dysarthria or facial asymmetry.     Sensory: Sensation is intact. No sensory deficit.     Motor: No weakness, tremor, atrophy, abnormal muscle tone or pronator drift.     Coordination: Coordination is intact. Romberg sign negative. Coordination normal. Finger-Nose-Finger Test normal.     Gait: Gait normal.     Deep Tendon Reflexes: Reflexes are normal and symmetric. Reflexes normal.     Comments: No focal cerebellar signs   Nl gait -tandem/heel/toe   Seemingly normal balance today   Psychiatric:        Mood and Affect: Mood normal.        Behavior: Behavior normal.        Thought Content: Thought content normal.           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 If related  Disc ways to improve balance  Wt loss  and general fitness as well  inst to watch for new symptoms/neurologic Also consider f/u with ENT if symptoms continue Also enc her to set up wellness labs and visit with PCP

## 2019-10-13 ENCOUNTER — Other Ambulatory Visit: Payer: Self-pay | Admitting: Internal Medicine

## 2019-10-13 MED ORDER — PHENTERMINE HCL 37.5 MG PO CAPS: 37.5000 mg | ORAL_CAPSULE | ORAL | 2 refills | Status: DC

## 2019-11-07 ENCOUNTER — Other Ambulatory Visit: Payer: Self-pay | Admitting: Internal Medicine

## 2019-11-07 MED ORDER — AMOXICILLIN 500 MG PO CAPS: 500.0000 mg | ORAL_CAPSULE | Freq: Three times a day (TID) | ORAL | 0 refills | Status: DC

## 2019-12-07 ENCOUNTER — Other Ambulatory Visit: Payer: Self-pay | Admitting: Internal Medicine

## 2019-12-07 MED ORDER — KETOCONAZOLE 200 MG PO TABS: 100.0000 mg | ORAL_TABLET | Freq: Every day | ORAL | 0 refills | Status: DC

## 2020-01-01 ENCOUNTER — Other Ambulatory Visit: Payer: Self-pay

## 2020-01-01 ENCOUNTER — Ambulatory Visit (INDEPENDENT_AMBULATORY_CARE_PROVIDER_SITE_OTHER): Payer: Managed Care, Other (non HMO) | Admitting: Internal Medicine

## 2020-01-01 ENCOUNTER — Encounter: Payer: Self-pay | Admitting: Internal Medicine

## 2020-01-01 VITALS — BP 124/82 | HR 109 | Temp 97.9°F | Ht 67.0 in | Wt 250.0 lb

## 2020-01-01 DIAGNOSIS — B379 Candidiasis, unspecified: Secondary | ICD-10-CM | POA: Diagnosis not present

## 2020-01-01 DIAGNOSIS — Z0001 Encounter for general adult medical examination with abnormal findings: Secondary | ICD-10-CM

## 2020-01-01 DIAGNOSIS — L732 Hidradenitis suppurativa: Secondary | ICD-10-CM | POA: Diagnosis not present

## 2020-01-01 NOTE — Progress Notes (Signed)
Subjective:    Patient ID: Kaitlyn Kennedy, female    DOB: Dec 05, 1981, 39 y.o.   MRN: [id number]  HPI  Pt presents to the clinic today for her annual exam.  Hidradenitis: Managed with Clindamycin wipes prescribed by dermatology. She currently has a leaking boil under her left breast.  Recurrent Yeast Infections: Managed on Nizoral tablets.  Flu: 09/2018 Tetanus: ? < 10 years ago Pap Smear: 08/2017 Dentist: annually  Diet: She does eat meat. She consumes fruits and veggies daily. She does eat some fried foods. She drinks mostly water. Exercise: Walking  Review of Systems      Past Medical History:  Diagnosis Date  . No pertinent past medical history     Current Outpatient Medications  Medication Sig Dispense Refill  . albuterol (VENTOLIN HFA) 108 (90 Base) MCG/ACT inhaler Inhale 1 puff into the lungs every 6 (six) hours as needed for wheezing or shortness of breath. 1 Inhaler 2  . amoxicillin (AMOXIL) 500 MG capsule Take 1 capsule (500 mg total) by mouth 3 (three) times daily. 30 capsule 0  . cetirizine (ZYRTEC) 10 MG tablet Take 1 tablet (10 mg total) by mouth daily. 90 tablet 2  . fluticasone (FLONASE) 50 MCG/ACT nasal spray Place 2 sprays into both nostrils daily. 16 g 6  . ibuprofen (ADVIL) 800 MG tablet TAKE 1 TABLET BY MOUTH EVERY 8 HOURS AS NEEDED 90 tablet 0  . ketoconazole (NIZORAL) 200 MG tablet Take 0.5 tablets (100 mg total) by mouth daily. 30 tablet 0  . Norethindrone Acetate-Ethinyl Estradiol (JUNEL 1.5/30) 1.5-30 MG-MCG tablet Take 1 tablet by mouth daily. 63 tablet 3  . omeprazole (PRILOSEC) 20 MG capsule TAKE 1 CAPSULE BY MOUTH EVERY DAY 90 capsule 1  . phentermine 37.5 MG capsule Take 1 capsule (37.5 mg total) by mouth every morning. 30 capsule 2   No current facility-administered medications for this visit.    No Known Allergies  Family History  Problem Relation Age of Onset  . Hypertension Mother   . Hyperlipidemia Mother   . Hypertension Father     . Diabetes Father   . Hyperlipidemia Father   . Breast cancer Maternal Grandmother   . Prostate cancer Maternal Grandfather   . Cervical cancer Paternal Grandmother   . Asthma Paternal Grandfather   . Emphysema Paternal Grandfather     Social History   Socioeconomic History  . Marital status: Married    Spouse name: Not on file  . Number of children: Not on file  . Years of education: Not on file  . Highest education level: Not on file  Occupational History  . Not on file  Tobacco Use  . Smoking status: Former Games developer  . Smokeless tobacco: Never Used  Substance and Sexual Activity  . Alcohol use: No  . Drug use: No  . Sexual activity: Yes    Partners: Male    Birth control/protection: Pill  Other Topics Concern  . Not on file  Social History Narrative  . Not on file   Social Determinants of Health   Financial Resource Strain:   . Difficulty of Paying Living Expenses: Not on file  Food Insecurity:   . Worried About Programme researcher, broadcasting/film/video in the Last Year: Not on file  . Ran Out of Food in the Last Year: Not on file  Transportation Needs:   . Lack of Transportation (Medical): Not on file  . Lack of Transportation (Non-Medical): Not on file  Physical Activity:   .  Days of Exercise per Week: Not on file  . Minutes of Exercise per Session: Not on file  Stress:   . Feeling of Stress : Not on file  Social Connections:   . Frequency of Communication with Friends and Family: Not on file  . Frequency of Social Gatherings with Friends and Family: Not on file  . Attends Religious Services: Not on file  . Active Member of Clubs or Organizations: Not on file  . Attends Banker Meetings: Not on file  . Marital Status: Not on file  Intimate Partner Violence:   . Fear of Current or Ex-Partner: Not on file  . Emotionally Abused: Not on file  . Physically Abused: Not on file  . Sexually Abused: Not on file     Constitutional: Denies fever, malaise, fatigue,  headache or abrupt weight changes.  HEENT: Denies eye pain, eye redness, ear pain, ringing in the ears, wax buildup, runny nose, nasal congestion, bloody nose, or sore throat. Respiratory: Denies difficulty breathing, shortness of breath, cough or sputum production.   Cardiovascular: Denies chest pain, chest tightness, palpitations or swelling in the hands or feet.  Gastrointestinal: Denies abdominal pain, bloating, constipation, diarrhea or blood in the stool.  GU: Denies urgency, frequency, pain with urination, burning sensation, blood in urine, odor or discharge. Musculoskeletal: Denies decrease in range of motion, difficulty with gait, muscle pain or joint pain and swelling.  Skin: Pt reports boil under left breast. Denies redness, rashes, or ulcercations.  Neurological: Denies dizziness, difficulty with memory, difficulty with speech or problems with balance and coordination.  Psych: Denies anxiety, depression, SI/HI.  No other specific complaints in a complete review of systems (except as listed in HPI above).  Objective:   Physical Exam  BP 124/82   Pulse (!) 109   Temp 97.9 F (36.6 C) (Temporal)   Ht 5\' 7"  (1.702 m)   Wt 250 lb (113.4 kg)   SpO2 98%   BMI 39.16 kg/m   Wt Readings from Last 3 Encounters:  09/23/19 244 lb (110.7 kg)  07/05/18 228 lb (103.4 kg)  03/19/18 225 lb (102.1 kg)    General: Appears her stated age, obese, in NAD. Skin: Warm, dry and intact. Open area draining pus/blood noted under left breast. HEENT: Head: normal shape and size; Eyes: sclera white, no icterus, conjunctiva pink, PERRLA and EOMs intact;  Neck:  Neck supple, trachea midline. No masses, lumps or thyromegaly present.  Cardiovascular: Normal rate and rhythm. S1,S2 noted.  No murmur, rubs or gallops noted. No JVD or BLE edema.  Pulmonary/Chest: Normal effort and positive vesicular breath sounds. No respiratory distress. No wheezes, rales or ronchi noted.  Abdomen: Soft and nontender.  Normal bowel sounds. No distention or masses noted. Liver, spleen and kidneys non palpable. Musculoskeletal: Strength 5/5 BUE/BLE. No difficulty with gait.  Neurological: Alert and oriented. Cranial nerves II-XII grossly intact. Coordination normal.  Psychiatric: Mood and affect normal. Behavior is normal. Judgment and thought content normal.     BMET    Component Value Date/Time   NA 137 07/05/2018 1341   K 4.0 07/05/2018 1341   CL 104 07/05/2018 1341   CO2 27 07/05/2018 1341   GLUCOSE 96 07/05/2018 1341   BUN 9 07/05/2018 1341   CREATININE 0.80 07/05/2018 1341   CREATININE 0.68 10/28/2013 1706   CALCIUM 9.2 07/05/2018 1341    Lipid Panel     Component Value Date/Time   CHOL 156 03/19/2018 1302   TRIG 138.0 03/19/2018  1302   HDL 52.50 03/19/2018 1302   CHOLHDL 3 03/19/2018 1302   VLDL 27.6 03/19/2018 1302   LDLCALC 75 03/19/2018 1302    CBC    Component Value Date/Time   WBC 9.8 03/19/2018 1302   RBC 4.28 03/19/2018 1302   HGB 13.0 03/19/2018 1302   HCT 38.9 03/19/2018 1302   PLT 319.0 03/19/2018 1302   MCV 91.0 03/19/2018 1302   MCH 29.6 10/28/2013 1706   MCHC 33.4 03/19/2018 1302   RDW 12.8 03/19/2018 1302   LYMPHSABS 2.5 03/14/2017 0929   MONOABS 0.7 03/14/2017 0929   EOSABS 0.3 03/14/2017 0929   BASOSABS 0.1 03/14/2017 0929    Hgb A1C Lab Results  Component Value Date   HGBA1C 5.4 03/19/2018            Assessment & Plan:   Preventative Health Maintenance:  She declines flu shot today She declines tetanus booster Pap smear UTD- she declines STD screening Encouraged her to consume a balanced diet and exercise regimen Advised her to see an eye doctor and dentist annually Will check CBC, CMET, Lipid and A1C today  RTC in 1 year, sooner if needed Webb Silversmith, NP This visit occurred during the SARS-CoV-2 public health emergency.  Safety protocols were in place, including screening questions prior to the visit, additional usage of staff PPE,  and extensive cleaning of exam room while observing appropriate contact time as indicated for disinfecting solutions.

## 2020-01-02 LAB — LIPID PANEL
Cholesterol: 164 mg/dL (ref 0–200)
HDL: 44.9 mg/dL (ref >39.00)
LDL Cholesterol: 87 mg/dL (ref 0–99)
NonHDL: 119.17
Total CHOL/HDL Ratio: 4
Triglycerides: 162 mg/dL — ABNORMAL HIGH (ref 0.0–149.0)
VLDL: 32.4 mg/dL (ref 0.0–40.0)

## 2020-01-02 LAB — COMPREHENSIVE METABOLIC PANEL
ALT: 18 U/L (ref 0–35)
AST: 18 U/L (ref 0–37)
Albumin: 4.1 g/dL (ref 3.5–5.2)
Alkaline Phosphatase: 53 U/L (ref 39–117)
BUN: 9 mg/dL (ref 6–23)
CO2: 28 mEq/L (ref 19–32)
Calcium: 9.5 mg/dL (ref 8.4–10.5)
Chloride: 106 mEq/L (ref 96–112)
Creatinine, Ser: 0.89 mg/dL (ref 0.40–1.20)
GFR: 86.72 mL/min (ref >60.00)
Glucose, Bld: 76 mg/dL (ref 70–99)
Potassium: 4 mEq/L (ref 3.5–5.1)
Sodium: 141 mEq/L (ref 135–145)
Total Bilirubin: 0.4 mg/dL (ref 0.2–1.2)
Total Protein: 7.1 g/dL (ref 6.0–8.3)

## 2020-01-02 LAB — CBC
HCT: 39.5 % (ref 36.0–46.0)
Hemoglobin: 13 g/dL (ref 12.0–15.0)
MCHC: 33 g/dL (ref 30.0–36.0)
MCV: 90.9 fl (ref 78.0–100.0)
Platelets: 411 10*3/uL — ABNORMAL HIGH (ref 150.0–400.0)
RBC: 4.34 Mil/uL (ref 3.87–5.11)
RDW: 13.1 % (ref 11.5–15.5)
WBC: 11.5 10*3/uL — ABNORMAL HIGH (ref 4.0–10.5)

## 2020-01-02 LAB — HEMOGLOBIN A1C: Hgb A1c MFr Bld: 5.7 % (ref 4.6–6.5)

## 2020-01-04 ENCOUNTER — Encounter: Payer: Self-pay | Admitting: Internal Medicine

## 2020-01-04 NOTE — Patient Instructions (Signed)
Health Maintenance, Female Adopting a healthy lifestyle and getting preventive care are important in promoting health and wellness. Ask your health care provider about:  The right schedule for you to have regular tests and exams.  Things you can do on your own to prevent diseases and keep yourself healthy. What should I know about diet, weight, and exercise? Eat a healthy diet   Eat a diet that includes plenty of vegetables, fruits, low-fat dairy products, and lean protein.  Do not eat a lot of foods that are high in solid fats, added sugars, or sodium. Maintain a healthy weight Body mass index (BMI) is used to identify weight problems. It estimates body fat based on height and weight. Your health care provider can help determine your BMI and help you achieve or maintain a healthy weight. Get regular exercise Get regular exercise. This is one of the most important things you can do for your health. Most adults should:  Exercise for at least 150 minutes each week. The exercise should increase your heart rate and make you sweat (moderate-intensity exercise).  Do strengthening exercises at least twice a week. This is in addition to the moderate-intensity exercise.  Spend less time sitting. Even light physical activity can be beneficial. Watch cholesterol and blood lipids Have your blood tested for lipids and cholesterol at 37 years of age, then have this test every 5 years. Have your cholesterol levels checked more often if:  Your lipid or cholesterol levels are high.  You are older than 37 years of age.  You are at high risk for heart disease. What should I know about cancer screening? Depending on your health history and family history, you may need to have cancer screening at various ages. This may include screening for:  Breast cancer.  Cervical cancer.  Colorectal cancer.  Skin cancer.  Lung cancer. What should I know about heart disease, diabetes, and high blood  pressure? Blood pressure and heart disease  High blood pressure causes heart disease and increases the risk of stroke. This is more likely to develop in people who have high blood pressure readings, are of African descent, or are overweight.  Have your blood pressure checked: ? Every 3-5 years if you are 18-39 years of age. ? Every year if you are 40 years old or older. Diabetes Have regular diabetes screenings. This checks your fasting blood sugar level. Have the screening done:  Once every three years after age 40 if you are at a normal weight and have a low risk for diabetes.  More often and at a younger age if you are overweight or have a high risk for diabetes. What should I know about preventing infection? Hepatitis B If you have a higher risk for hepatitis B, you should be screened for this virus. Talk with your health care provider to find out if you are at risk for hepatitis B infection. Hepatitis C Testing is recommended for:  Everyone born from 1945 through 1965.  Anyone with known risk factors for hepatitis C. Sexually transmitted infections (STIs)  Get screened for STIs, including gonorrhea and chlamydia, if: ? You are sexually active and are younger than 37 years of age. ? You are older than 37 years of age and your health care provider tells you that you are at risk for this type of infection. ? Your sexual activity has changed since you were last screened, and you are at increased risk for chlamydia or gonorrhea. Ask your health care provider if   you are at risk.  Ask your health care provider about whether you are at high risk for HIV. Your health care provider may recommend a prescription medicine to help prevent HIV infection. If you choose to take medicine to prevent HIV, you should first get tested for HIV. You should then be tested every 3 months for as long as you are taking the medicine. Pregnancy  If you are about to stop having your period (premenopausal) and  you may become pregnant, seek counseling before you get pregnant.  Take 400 to 800 micrograms (mcg) of folic acid every day if you become pregnant.  Ask for birth control (contraception) if you want to prevent pregnancy. Osteoporosis and menopause Osteoporosis is a disease in which the bones lose minerals and strength with aging. This can result in bone fractures. If you are 65 years old or older, or if you are at risk for osteoporosis and fractures, ask your health care provider if you should:  Be screened for bone loss.  Take a calcium or vitamin D supplement to lower your risk of fractures.  Be given hormone replacement therapy (HRT) to treat symptoms of menopause. Follow these instructions at home: Lifestyle  Do not use any products that contain nicotine or tobacco, such as cigarettes, e-cigarettes, and chewing tobacco. If you need help quitting, ask your health care provider.  Do not use street drugs.  Do not share needles.  Ask your health care provider for help if you need support or information about quitting drugs. Alcohol use  Do not drink alcohol if: ? Your health care provider tells you not to drink. ? You are pregnant, may be pregnant, or are planning to become pregnant.  If you drink alcohol: ? Limit how much you use to 0-1 drink a day. ? Limit intake if you are breastfeeding.  Be aware of how much alcohol is in your drink. In the [place] one drink equals one 12 oz bottle of beer (355 mL), one 5 oz glass of wine (148 mL), or one 1 oz glass of hard liquor (44 mL). General instructions  Schedule regular health, dental, and eye exams.  Stay current with your vaccines.  Tell your health care provider if: ? You often feel depressed. ? You have ever been abused or do not feel safe at home. Summary  Adopting a healthy lifestyle and getting preventive care are important in promoting health and wellness.  Follow your health care provider's instructions about healthy  diet, exercising, and getting tested or screened for diseases.  Follow your health care provider's instructions on monitoring your cholesterol and blood pressure. This information is not intended to replace advice given to you by your health care provider. Make sure you discuss any questions you have with your health care provider. Document Revised: 11/20/2018 Document Reviewed: 11/20/2018 Elsevier Patient Education  2020 [employer]  

## 2020-01-04 NOTE — Assessment & Plan Note (Signed)
Continue Clindamycin wipes

## 2020-01-04 NOTE — Assessment & Plan Note (Signed)
Continue Nizoral CMET today

## 2020-01-11 ENCOUNTER — Other Ambulatory Visit: Payer: Self-pay | Admitting: Internal Medicine

## 2020-01-12 ENCOUNTER — Other Ambulatory Visit: Payer: Self-pay | Admitting: Internal Medicine

## 2020-01-12 MED ORDER — KETOCONAZOLE 200 MG PO TABS: 100.0000 mg | ORAL_TABLET | Freq: Every day | ORAL | 0 refills | Status: DC

## 2020-01-12 MED ORDER — NORETHINDRONE ACET-ETHINYL EST 1.5-30 MG-MCG PO TABS: 1.0000 | ORAL_TABLET | Freq: Every day | ORAL | 3 refills | Status: DC

## 2020-01-15 ENCOUNTER — Other Ambulatory Visit: Payer: Self-pay | Admitting: Internal Medicine

## 2020-01-15 MED ORDER — IBUPROFEN 800 MG PO TABS: 800.0000 mg | ORAL_TABLET | Freq: Three times a day (TID) | ORAL | 0 refills | Status: AC | PRN

## 2020-03-01 ENCOUNTER — Other Ambulatory Visit: Payer: Self-pay | Admitting: Internal Medicine

## 2020-03-01 DIAGNOSIS — L732 Hidradenitis suppurativa: Secondary | ICD-10-CM

## 2020-09-19 ENCOUNTER — Other Ambulatory Visit: Payer: Self-pay | Admitting: Internal Medicine

## 2020-11-21 ENCOUNTER — Other Ambulatory Visit: Payer: Self-pay | Admitting: Internal Medicine

## 2020-12-03 ENCOUNTER — Other Ambulatory Visit: Payer: Self-pay | Admitting: Internal Medicine

## 2021-01-24 ENCOUNTER — Other Ambulatory Visit: Payer: Self-pay | Admitting: Internal Medicine

## 2021-02-01 ENCOUNTER — Encounter: Payer: Self-pay | Admitting: Internal Medicine

## 2021-02-08 ENCOUNTER — Telehealth: Payer: Self-pay | Admitting: *Deleted

## 2021-02-08 MED ORDER — OMEPRAZOLE 20 MG PO CPDR: 20.0000 mg | DELAYED_RELEASE_CAPSULE | Freq: Every day | ORAL | 1 refills | Status: DC

## 2021-02-08 MED ORDER — NORETHINDRONE ACET-ETHINYL EST 1.5-30 MG-MCG PO TABS: 1.0000 | ORAL_TABLET | Freq: Every day | ORAL | 0 refills | Status: DC

## 2021-02-08 NOTE — Telephone Encounter (Signed)
Pt called in asking if PCP can prescribe omeprazole, she's having some GERD and she can get it on her flex card if PCP send a Rx in, CPE scheduled 02/14/21  [facility]

## 2021-02-08 NOTE — Telephone Encounter (Signed)
Rx sent through e-scribe  

## 2021-02-14 ENCOUNTER — Ambulatory Visit (INDEPENDENT_AMBULATORY_CARE_PROVIDER_SITE_OTHER): Payer: 59 | Admitting: Internal Medicine

## 2021-02-14 ENCOUNTER — Other Ambulatory Visit: Payer: Self-pay

## 2021-02-14 ENCOUNTER — Encounter: Payer: Self-pay | Admitting: Internal Medicine

## 2021-02-14 VITALS — BP 128/82 | HR 91 | Temp 98.4°F | Ht 67.5 in | Wt 262.0 lb

## 2021-02-14 DIAGNOSIS — L732 Hidradenitis suppurativa: Secondary | ICD-10-CM

## 2021-02-14 DIAGNOSIS — Z1159 Encounter for screening for other viral diseases: Secondary | ICD-10-CM | POA: Diagnosis not present

## 2021-02-14 DIAGNOSIS — Z0000 Encounter for general adult medical examination without abnormal findings: Secondary | ICD-10-CM

## 2021-02-14 LAB — LIPID PANEL
Cholesterol: 143 mg/dL (ref 0–200)
HDL: 48.8 mg/dL (ref >39.00)
LDL Cholesterol: 74 mg/dL (ref 0–99)
NonHDL: 94.11
Total CHOL/HDL Ratio: 3
Triglycerides: 103 mg/dL (ref 0.0–149.0)
VLDL: 20.6 mg/dL (ref 0.0–40.0)

## 2021-02-14 LAB — COMPREHENSIVE METABOLIC PANEL
ALT: 12 U/L (ref 0–35)
AST: 14 U/L (ref 0–37)
Albumin: 3.7 g/dL (ref 3.5–5.2)
Alkaline Phosphatase: 65 U/L (ref 39–117)
BUN: 9 mg/dL (ref 6–23)
CO2: 27 mEq/L (ref 19–32)
Calcium: 9.1 mg/dL (ref 8.4–10.5)
Chloride: 107 mEq/L (ref 96–112)
Creatinine, Ser: 0.85 mg/dL (ref 0.40–1.20)
GFR: 87.56 mL/min (ref >60.00)
Glucose, Bld: 74 mg/dL (ref 70–99)
Potassium: 4.1 mEq/L (ref 3.5–5.1)
Sodium: 139 mEq/L (ref 135–145)
Total Bilirubin: 0.4 mg/dL (ref 0.2–1.2)
Total Protein: 6.8 g/dL (ref 6.0–8.3)

## 2021-02-14 LAB — CBC
HCT: 37.2 % (ref 36.0–46.0)
Hemoglobin: 12.3 g/dL (ref 12.0–15.0)
MCHC: 33.2 g/dL (ref 30.0–36.0)
MCV: 88.1 fl (ref 78.0–100.0)
Platelets: 382 10*3/uL (ref 150.0–400.0)
RBC: 4.22 Mil/uL (ref 3.87–5.11)
RDW: 13.2 % (ref 11.5–15.5)
WBC: 11.7 10*3/uL — ABNORMAL HIGH (ref 4.0–10.5)

## 2021-02-14 LAB — TSH: TSH: 0.89 u[IU]/mL (ref 0.35–4.50)

## 2021-02-14 LAB — HEMOGLOBIN A1C: Hgb A1c MFr Bld: 5.9 % (ref 4.6–6.5)

## 2021-02-14 MED ORDER — CLINDAMYCIN HCL 300 MG PO CAPS: 300.0000 mg | ORAL_CAPSULE | Freq: Every day | ORAL | 0 refills | Status: DC

## 2021-02-14 MED ORDER — PHENTERMINE HCL 37.5 MG PO CAPS: 37.5000 mg | ORAL_CAPSULE | ORAL | 0 refills | Status: DC

## 2021-02-14 NOTE — Progress Notes (Signed)
Subjective:    Patient ID: Kaitlyn Kennedy, female    DOB: 23-Mar-1981, 40 y.o.   MRN: [id number]  HPI  Patient presents the clinic today for her annual exam.  She is still struggling with hidradenitis.  She did consult with a plastic surgeon about a breast reduction but reports this fell through.  She would like to try clindamycin daily for suppression.  She also would like to work on weight loss.  She would like a prescription for Phentermine.  Her weight today is 262 pounds with a BMI of 40.13.  Flu: Never Tetanus: >10 years ago Covid: [place] x2 Pap smear: 08/2017 Dentist: Annually  Diet: She does eat meat.  She consumes fruits and vegetables.  She does eat some fried foods.  She drinks mostly water.   Exercise: Walking  Review of Systems      Past Medical History:  Diagnosis Date  . No pertinent past medical history     Current Outpatient Medications  Medication Sig Dispense Refill  . albuterol (VENTOLIN HFA) 108 (90 Base) MCG/ACT inhaler Inhale 1 puff into the lungs every 6 (six) hours as needed for wheezing or shortness of breath. 1 Inhaler 2  . cetirizine (ZYRTEC) 10 MG tablet Take 1 tablet (10 mg total) by mouth daily. 90 tablet 2  . fluticasone (FLONASE) 50 MCG/ACT nasal spray Place 2 sprays into both nostrils daily. 16 g 6  . ibuprofen (ADVIL) 800 MG tablet Take 1 tablet (800 mg total) by mouth every 8 (eight) hours as needed. 90 tablet 0  . ketoconazole (NIZORAL) 200 MG tablet TAKE 1/2 TABLET BY MOUTH DAILY 30 tablet 0  . Norethindrone Acetate-Ethinyl Estradiol (JUNEL 1.5/30) 1.5-30 MG-MCG tablet Take 1 tablet by mouth daily. 84 tablet 0  . omeprazole (PRILOSEC) 20 MG capsule Take 1 capsule (20 mg total) by mouth daily. 90 capsule 1   No current facility-administered medications for this visit.    No Known Allergies  Family History  Problem Relation Age of Onset  . Hypertension Mother   . Hyperlipidemia Mother   . Hypertension Father   . Diabetes Father    . Hyperlipidemia Father   . Breast cancer Maternal Grandmother   . Prostate cancer Maternal Grandfather   . Cervical cancer Paternal Grandmother   . Asthma Paternal Grandfather   . Emphysema Paternal Grandfather     Social History   Socioeconomic History  . Marital status: Married    Spouse name: Not on file  . Number of children: Not on file  . Years of education: Not on file  . Highest education level: Not on file  Occupational History  . Not on file  Tobacco Use  . Smoking status: Former Research scientist (life sciences)  . Smokeless tobacco: Never Used  Vaping Use  . Vaping Use: Never used  Substance and Sexual Activity  . Alcohol use: No  . Drug use: No  . Sexual activity: Yes    Partners: Male    Birth control/protection: Pill  Other Topics Concern  . Not on file  Social History Narrative  . Not on file   Social Determinants of Health   Financial Resource Strain: Not on file  Food Insecurity: Not on file  Transportation Needs: Not on file  Physical Activity: Not on file  Stress: Not on file  Social Connections: Not on file  Intimate Partner Violence: Not on file     Constitutional: Patient reports weight gain.  Denies fever, malaise, fatigue, headache.  HEENT: Denies eye pain,  eye redness, ear pain, ringing in the ears, wax buildup, runny nose, nasal congestion, bloody nose, or sore throat. Respiratory: Denies difficulty breathing, shortness of breath, cough or sputum production.   Cardiovascular: Denies chest pain, chest tightness, palpitations or swelling in the hands or feet.  Gastrointestinal: Denies abdominal pain, bloating, constipation, diarrhea or blood in the stool.  GU: Denies urgency, frequency, pain with urination, burning sensation, blood in urine, odor or discharge. Musculoskeletal: Denies decrease in range of motion, difficulty with gait, muscle pain or joint pain and swelling.  Skin: Patient reports multiple boils under her breasts.  Denies redness, rashes, or  ulcercations.  Neurological: Denies dizziness, difficulty with memory, difficulty with speech or problems with balance and coordination.  Psych: Denies anxiety, depression, SI/HI.  No other specific complaints in a complete review of systems (except as listed in HPI above).  Objective:   Physical Exam BP 128/82   Pulse 91   Temp 98.4 F (36.9 C) (Temporal)   Ht 5' 7.5" (1.715 m)   Wt 262 lb (118.8 kg)   SpO2 98%   BMI 40.43 kg/m   Wt Readings from Last 3 Encounters:  01/01/20 250 lb (113.4 kg)  09/23/19 244 lb (110.7 kg)  07/05/18 228 lb (103.4 kg)    General: Appears her stated age, obese, in NAD. Skin: Warm, dry and intact.  Multiple hyperpigmented areas noted under bilateral breasts. HEENT: Head: normal shape and size; Eyes: sclera white, no icterus, conjunctiva pink, PERRLA and EOMs intact;  Neck:  Neck supple, trachea midline. No masses, lumps or thyromegaly present.  Cardiovascular: Normal rate and rhythm. S1,S2 noted.  No murmur, rubs or gallops noted. No JVD or BLE edema.  Pulmonary/Chest: Normal effort and positive vesicular breath sounds. No respiratory distress. No wheezes, rales or ronchi noted.  Abdomen: Soft and nontender. Normal bowel sounds. No distention or masses noted. Liver, spleen and kidneys non palpable. Musculoskeletal: Strength 5/5 BUE/BLE.  No difficulty with gait.  Neurological: Alert and oriented. Cranial nerves II-XII grossly intact. Coordination normal.  Psychiatric: Mood and affect normal. Behavior is normal. Judgment and thought content normal.    BMET    Component Value Date/Time   NA 141 01/01/2020 1558   K 4.0 01/01/2020 1558   CL 106 01/01/2020 1558   CO2 28 01/01/2020 1558   GLUCOSE 76 01/01/2020 1558   BUN 9 01/01/2020 1558   CREATININE 0.89 01/01/2020 1558   CREATININE 0.68 10/28/2013 1706   CALCIUM 9.5 01/01/2020 1558    Lipid Panel     Component Value Date/Time   CHOL 164 01/01/2020 1558   TRIG 162.0 (H) 01/01/2020 1558    HDL 44.90 01/01/2020 1558   CHOLHDL 4 01/01/2020 1558   VLDL 32.4 01/01/2020 1558   LDLCALC 87 01/01/2020 1558    CBC    Component Value Date/Time   WBC 11.5 (H) 01/01/2020 1558   RBC 4.34 01/01/2020 1558   HGB 13.0 01/01/2020 1558   HCT 39.5 01/01/2020 1558   PLT 411.0 (H) 01/01/2020 1558   MCV 90.9 01/01/2020 1558   MCH 29.6 10/28/2013 1706   MCHC 33.0 01/01/2020 1558   RDW 13.1 01/01/2020 1558   LYMPHSABS 2.5 03/14/2017 0929   MONOABS 0.7 03/14/2017 0929   EOSABS 0.3 03/14/2017 0929   BASOSABS 0.1 03/14/2017 0929    Hgb A1C Lab Results  Component Value Date   HGBA1C 5.7 01/01/2020             Assessment & Plan:  Preventive Health  Maintenance:  She declines flu shot She declines tetanus shot Encouraged her to get her COVID booster Pap smear UTD Encouraged her to consume a balanced diet and exercise regimen Advised her to see an eye doctor and dentist annually We will check CBC, c-Met, TSH, lipid, A1c and hep C today  Abnormal Weight Gain:  Rx for Phentermine 37.5 mg p.o. daily Encouraged her to consume a low carb diet and exercise for weight loss Update me in 1 month with your weight  RTC in 1 year, sooner if needed  Webb Silversmith, NP This visit occurred during the SARS-CoV-2 public health emergency.  Safety protocols were in place, including screening questions prior to the visit, additional usage of staff PPE, and extensive cleaning of exam room while observing appropriate contact time as indicated for disinfecting solutions.

## 2021-02-15 ENCOUNTER — Encounter: Payer: Self-pay | Admitting: Internal Medicine

## 2021-02-15 LAB — HEPATITIS C ANTIBODY
Hepatitis C Ab: NONREACTIVE
SIGNAL TO CUT-OFF: 0.17 (ref <1.00)

## 2021-02-15 NOTE — Patient Instructions (Signed)
Health Maintenance, Female Adopting a healthy lifestyle and getting preventive care are important in promoting health and wellness. Ask your health care provider about:  The right schedule for you to have regular tests and exams.  Things you can do on your own to prevent diseases and keep yourself healthy. What should I know about diet, weight, and exercise? Eat a healthy diet  Eat a diet that includes plenty of vegetables, fruits, low-fat dairy products, and lean protein.  Do not eat a lot of foods that are high in solid fats, added sugars, or sodium.   Maintain a healthy weight Body mass index (BMI) is used to identify weight problems. It estimates body fat based on height and weight. Your health care provider can help determine your BMI and help you achieve or maintain a healthy weight. Get regular exercise Get regular exercise. This is one of the most important things you can do for your health. Most adults should:  Exercise for at least 150 minutes each week. The exercise should increase your heart rate and make you sweat (moderate-intensity exercise).  Do strengthening exercises at least twice a week. This is in addition to the moderate-intensity exercise.  Spend less time sitting. Even light physical activity can be beneficial. Watch cholesterol and blood lipids Have your blood tested for lipids and cholesterol at 38 years of age, then have this test every 5 years. Have your cholesterol levels checked more often if:  Your lipid or cholesterol levels are high.  You are older than 38 years of age.  You are at high risk for heart disease. What should I know about cancer screening? Depending on your health history and family history, you may need to have cancer screening at various ages. This may include screening for:  Breast cancer.  Cervical cancer.  Colorectal cancer.  Skin cancer.  Lung cancer. What should I know about heart disease, diabetes, and high blood  pressure? Blood pressure and heart disease  High blood pressure causes heart disease and increases the risk of stroke. This is more likely to develop in people who have high blood pressure readings, are of African descent, or are overweight.  Have your blood pressure checked: ? Every 3-5 years if you are 18-39 years of age. ? Every year if you are 40 years old or older. Diabetes Have regular diabetes screenings. This checks your fasting blood sugar level. Have the screening done:  Once every three years after age 40 if you are at a normal weight and have a low risk for diabetes.  More often and at a younger age if you are overweight or have a high risk for diabetes. What should I know about preventing infection? Hepatitis B If you have a higher risk for hepatitis B, you should be screened for this virus. Talk with your health care provider to find out if you are at risk for hepatitis B infection. Hepatitis C Testing is recommended for:  Everyone born from 1945 through 1965.  Anyone with known risk factors for hepatitis C. Sexually transmitted infections (STIs)  Get screened for STIs, including gonorrhea and chlamydia, if: ? You are sexually active and are younger than 38 years of age. ? You are older than 38 years of age and your health care provider tells you that you are at risk for this type of infection. ? Your sexual activity has changed since you were last screened, and you are at increased risk for chlamydia or gonorrhea. Ask your health care provider   if you are at risk.  Ask your health care provider about whether you are at high risk for HIV. Your health care provider may recommend a prescription medicine to help prevent HIV infection. If you choose to take medicine to prevent HIV, you should first get tested for HIV. You should then be tested every 3 months for as long as you are taking the medicine. Pregnancy  If you are about to stop having your period (premenopausal) and  you may become pregnant, seek counseling before you get pregnant.  Take 400 to 800 micrograms (mcg) of folic acid every day if you become pregnant.  Ask for birth control (contraception) if you want to prevent pregnancy. Osteoporosis and menopause Osteoporosis is a disease in which the bones lose minerals and strength with aging. This can result in bone fractures. If you are 65 years old or older, or if you are at risk for osteoporosis and fractures, ask your health care provider if you should:  Be screened for bone loss.  Take a calcium or vitamin D supplement to lower your risk of fractures.  Be given hormone replacement therapy (HRT) to treat symptoms of menopause. Follow these instructions at home: Lifestyle  Do not use any products that contain nicotine or tobacco, such as cigarettes, e-cigarettes, and chewing tobacco. If you need help quitting, ask your health care provider.  Do not use street drugs.  Do not share needles.  Ask your health care provider for help if you need support or information about quitting drugs. Alcohol use  Do not drink alcohol if: ? Your health care provider tells you not to drink. ? You are pregnant, may be pregnant, or are planning to become pregnant.  If you drink alcohol: ? Limit how much you use to 0-1 drink a day. ? Limit intake if you are breastfeeding.  Be aware of how much alcohol is in your drink. In the [place] one drink equals one 12 oz bottle of beer (355 mL), one 5 oz glass of wine (148 mL), or one 1 oz glass of hard liquor (44 mL). General instructions  Schedule regular health, dental, and eye exams.  Stay current with your vaccines.  Tell your health care provider if: ? You often feel depressed. ? You have ever been abused or do not feel safe at home. Summary  Adopting a healthy lifestyle and getting preventive care are important in promoting health and wellness.  Follow your health care provider's instructions about healthy  diet, exercising, and getting tested or screened for diseases.  Follow your health care provider's instructions on monitoring your cholesterol and blood pressure. This information is not intended to replace advice given to you by your health care provider. Make sure you discuss any questions you have with your health care provider. Document Revised: 11/20/2018 Document Reviewed: 11/20/2018 Elsevier Patient Education  2021 [employer]vier Inc.  

## 2021-02-15 NOTE — Assessment & Plan Note (Signed)
We will trial clindamycin 300 mg p.o. daily for suppression

## 2021-05-06 ENCOUNTER — Other Ambulatory Visit: Payer: Self-pay | Admitting: Internal Medicine

## 2021-05-06 NOTE — Telephone Encounter (Signed)
  Notes to clinic Not a practice we approve rx for, no upcoming appt scheduled with Sampson Si, NP.

## 2021-08-16 ENCOUNTER — Other Ambulatory Visit: Payer: Self-pay

## 2021-08-16 MED ORDER — NORETHINDRONE ACET-ETHINYL EST 1.5-30 MG-MCG PO TABS: 1.0000 | ORAL_TABLET | Freq: Every day | ORAL | 2 refills | Status: DC

## 2021-08-16 NOTE — Telephone Encounter (Signed)
Refill request from pharmacy on [facility]. Patient is following [facility] to [facility].

## 2021-11-21 ENCOUNTER — Ambulatory Visit: Payer: Self-pay | Admitting: Internal Medicine

## 2022-02-17 ENCOUNTER — Ambulatory Visit: Payer: Self-pay | Admitting: Internal Medicine

## 2022-02-21 ENCOUNTER — Ambulatory Visit (INDEPENDENT_AMBULATORY_CARE_PROVIDER_SITE_OTHER): Payer: 59 | Admitting: Internal Medicine

## 2022-02-21 ENCOUNTER — Other Ambulatory Visit: Payer: Self-pay

## 2022-02-21 ENCOUNTER — Encounter: Payer: Self-pay | Admitting: Internal Medicine

## 2022-02-21 VITALS — BP 139/72 | HR 99 | Temp 97.3°F | Ht 67.5 in | Wt 254.0 lb

## 2022-02-21 DIAGNOSIS — N760 Acute vaginitis: Secondary | ICD-10-CM | POA: Insufficient documentation

## 2022-02-21 DIAGNOSIS — E66812 Obesity, class 2: Secondary | ICD-10-CM | POA: Insufficient documentation

## 2022-02-21 DIAGNOSIS — R7303 Prediabetes: Secondary | ICD-10-CM

## 2022-02-21 DIAGNOSIS — L732 Hidradenitis suppurativa: Secondary | ICD-10-CM

## 2022-02-21 DIAGNOSIS — E6609 Other obesity due to excess calories: Secondary | ICD-10-CM

## 2022-02-21 DIAGNOSIS — Z0001 Encounter for general adult medical examination with abnormal findings: Secondary | ICD-10-CM

## 2022-02-21 DIAGNOSIS — Z6839 Body mass index (BMI) 39.0-39.9, adult: Secondary | ICD-10-CM

## 2022-02-21 MED ORDER — CLINDAMYCIN PHOSPHATE 1 % EX GEL: Freq: Two times a day (BID) | CUTANEOUS | 0 refills | Status: DC

## 2022-02-21 NOTE — Assessment & Plan Note (Signed)
Encourage diet and exercise for weight loss 

## 2022-02-21 NOTE — Progress Notes (Signed)
? ?Subjective:  ? ? Patient ID: Kaitlyn Kennedy, female    DOB: 1981/07/22, 41 y.o.   MRN: [id number] ? ?HPI ? ?Patient presents to clinic today for her annual exam.  She is also due to follow-up chronic conditions ? ?Hidradenitis suppurativa: Managed on Humira, oral Clindamycin and Clindamycin gel.  She follows with dermatology. ? ?Recurrent Vaginitis: Managed with Ketoconazole as needed. ? ?Prediabetes: Her last A1c was 5.9%, 6 prediabetes: Her last A1c was 5.9%, 02/2021.  She is not taking any oral diabetic medication at this time.  She does not check her sugars. ? ?Flu: 09/2018 ?Tetanus: < 10 years ago ?COVID: Pfizer x2 ?Pap smear: 08/2017, GYN ?Dentist: annually ? ?Diet: She does eat meat. She consumes fruits and veggies. She does eat some fried foods. She drinks lemonade ?Exercise: Walking ? ?Review of Systems ? ?   ?Past Medical History:  ?Diagnosis Date  ? No pertinent past medical history   ? ? ?Current Outpatient Medications  ?Medication Sig Dispense Refill  ? clindamycin (CLEOCIN) 300 MG capsule Take 1 capsule (300 mg total) by mouth daily. 90 capsule 0  ? fluticasone (FLONASE) 50 MCG/ACT nasal spray Place 2 sprays into both nostrils daily. 16 g 6  ? ibuprofen (ADVIL) 800 MG tablet Take 1 tablet (800 mg total) by mouth every 8 (eight) hours as needed. 90 tablet 0  ? ketoconazole (NIZORAL) 200 MG tablet TAKE 1/2 TABLET BY MOUTH DAILY 30 tablet 0  ? Norethindrone Acetate-Ethinyl Estradiol (JUNEL 1.5/30) 1.5-30 MG-MCG tablet Take 1 tablet by mouth daily. 84 tablet 2  ? omeprazole (PRILOSEC) 20 MG capsule Take 1 capsule (20 mg total) by mouth daily. 90 capsule 1  ? phentermine 37.5 MG capsule Take 1 capsule (37.5 mg total) by mouth every morning. 90 capsule 0  ? ?No current facility-administered medications for this visit.  ? ? ?No Known Allergies ? ?Family History  ?Problem Relation Age of Onset  ? Hypertension Mother   ? Hyperlipidemia Mother   ? Hypertension Father   ? Diabetes Father   ? Hyperlipidemia  Father   ? Breast cancer Maternal Grandmother   ? Prostate cancer Maternal Grandfather   ? Cervical cancer Paternal Grandmother   ? Asthma Paternal Grandfather   ? Emphysema Paternal Grandfather   ? ? ?Social History  ? ?Socioeconomic History  ? Marital status: Married  ?  Spouse name: Not on file  ? Number of children: Not on file  ? Years of education: Not on file  ? Highest education level: Not on file  ?Occupational History  ? Not on file  ?Tobacco Use  ? Smoking status: Former  ? Smokeless tobacco: Never  ?Vaping Use  ? Vaping Use: Never used  ?Substance and Sexual Activity  ? Alcohol use: No  ? Drug use: No  ? Sexual activity: Yes  ?  Partners: Male  ?  Birth control/protection: Pill  ?Other Topics Concern  ? Not on file  ?Social History Narrative  ? Not on file  ? ?Social Determinants of Health  ? ?Financial Resource Strain: Not on file  ?Food Insecurity: Not on file  ?Transportation Needs: Not on file  ?Physical Activity: Not on file  ?Stress: Not on file  ?Social Connections: Not on file  ?Intimate Partner Violence: Not on file  ? ? ? ?Constitutional: Denies fever, malaise, fatigue, headache or abrupt weight changes.  ?HEENT: Denies eye pain, eye redness, ear pain, ringing in the ears, wax buildup, runny nose, nasal congestion, bloody nose, or  sore throat. ?Respiratory: Denies difficulty breathing, shortness of breath, cough or sputum production.   ?Cardiovascular: Denies chest pain, chest tightness, palpitations or swelling in the hands or feet.  ?Gastrointestinal: Denies abdominal pain, bloating, constipation, diarrhea or blood in the stool.  ?GU: Denies urgency, frequency, pain with urination, burning sensation, blood in urine, odor or discharge. ?Musculoskeletal: Denies decrease in range of motion, difficulty with gait, muscle pain or joint pain and swelling.  ?Skin: Patient reports recurring boils under the breast and in the groin.  Denies ulcercations.  ?Neurological: Denies dizziness, difficulty with  memory, difficulty with speech or problems with balance and coordination.  ?Psych: Denies anxiety, depression, SI/HI. ? ?No other specific complaints in a complete review of systems (except as listed in HPI above). ? ?Objective:  ? Physical Exam ? ? ?BP 139/72 (BP Location: Left Arm, Patient Position: Sitting, Cuff Size: Large)   Pulse 99   Temp (!) 97.3 ?F (36.3 ?C) (Temporal)   Ht 5' 7.5" (1.715 m)   Wt 254 lb (115.2 kg)   SpO2 100%   BMI 39.19 kg/m?  ? ?Wt Readings from Last 3 Encounters:  ?02/14/21 262 lb (118.8 kg)  ?01/01/20 250 lb (113.4 kg)  ?09/23/19 244 lb (110.7 kg)  ? ? ?General: Appears her stated age, obese, in NAD. ?Skin: Warm, dry and intact.  Scarring noted under the breast and bilateral groins. ?HEENT: Head: normal shape and size; Eyes: sclera white and EOMs intact;  ?Neck:  Neck supple, trachea midline. No masses, lumps or thyromegaly present.  ?Cardiovascular: Normal rate and rhythm. S1,S2 noted.  No murmur, rubs or gallops noted. No JVD or BLE edema.  ?Pulmonary/Chest: Normal effort and positive vesicular breath sounds. No respiratory distress. No wheezes, rales or ronchi noted.  ?Abdomen: Soft and nontender. Normal bowel sounds.  ?Musculoskeletal: Strength 5/5 BUE/BLE.  No difficulty with gait.  ?Neurological: Alert and oriented. Cranial nerves II-XII grossly intact. Coordination normal.  ?Psychiatric: Mood and affect normal. Behavior is normal. Judgment and thought content normal.  ? ? ? ?BMET ?   ?Component Value Date/Time  ? NA 139 02/14/2021 1255  ? K 4.1 02/14/2021 1255  ? CL 107 02/14/2021 1255  ? CO2 27 02/14/2021 1255  ? GLUCOSE 74 02/14/2021 1255  ? BUN 9 02/14/2021 1255  ? CREATININE 0.85 02/14/2021 1255  ? CREATININE 0.68 10/28/2013 1706  ? CALCIUM 9.1 02/14/2021 1255  ? ? ?Lipid Panel  ?   ?Component Value Date/Time  ? CHOL 143 02/14/2021 1255  ? TRIG 103.0 02/14/2021 1255  ? HDL 48.80 02/14/2021 1255  ? CHOLHDL 3 02/14/2021 1255  ? VLDL 20.6 02/14/2021 1255  ? [place] 74  02/14/2021 1255  ? ? ?CBC ?   ?Component Value Date/Time  ? WBC 11.7 (H) 02/14/2021 1255  ? RBC 4.22 02/14/2021 1255  ? HGB 12.3 02/14/2021 1255  ? HCT 37.2 02/14/2021 1255  ? PLT 382.0 02/14/2021 1255  ? MCV 88.1 02/14/2021 1255  ? MCH 29.6 10/28/2013 1706  ? MCHC 33.2 02/14/2021 1255  ? RDW 13.2 02/14/2021 1255  ? LYMPHSABS 2.5 03/14/2017 0929  ? MONOABS 0.7 03/14/2017 0929  ? EOSABS 0.3 03/14/2017 0929  ? BASOSABS 0.1 03/14/2017 0929  ? ? ?Hgb A1C ?Lab Results  ?Component Value Date  ? HGBA1C 5.9 02/14/2021  ? ? ? ? ? ?   ?Assessment & Plan:  ? ?Preventative Health Maintenance: ? ?Encouraged her to get a flu shot in fall ?Tetanus UTD per her report ?Encouraged her to get  her COVID booster ?Pap smear due, she will schedule this with her GYN ?Encouraged her to consume a balanced diet and exercise regimen ?Advised her to see an eye doctor and dentist annually ?We will check CBC, c-Met, lipid, A1c today ? ?RTC in 1 year, sooner if needed ?Webb Silversmith, NP ?This visit occurred during the SARS-CoV-2 public health emergency.  Safety protocols were in place, including screening questions prior to the visit, additional usage of staff PPE, and extensive cleaning of exam room while observing appropriate contact time as indicated for disinfecting solutions.  ? ?

## 2022-02-21 NOTE — Patient Instructions (Signed)

## 2022-02-21 NOTE — Assessment & Plan Note (Signed)
A1c today Encourage low-carb diet and exercise for weight loss 

## 2022-02-21 NOTE — Assessment & Plan Note (Signed)
Managed on Humira, oral Clindamycin and Clindamycin gel ?She will continue to follow with dermatology ?

## 2022-02-21 NOTE — Assessment & Plan Note (Signed)
Continue Ketoconazole as needed ?

## 2022-02-22 LAB — CBC
HCT: 39.1 % (ref 35.0–45.0)
Hemoglobin: 12.9 g/dL (ref 11.7–15.5)
MCH: 28.9 pg (ref 27.0–33.0)
MCHC: 33 g/dL (ref 32.0–36.0)
MCV: 87.5 fL (ref 80.0–100.0)
MPV: 9.8 fL (ref 7.5–12.5)
Platelets: 360 10*3/uL (ref 140–400)
RBC: 4.47 10*6/uL (ref 3.80–5.10)
RDW: 12.9 % (ref 11.0–15.0)
WBC: 11.2 10*3/uL — ABNORMAL HIGH (ref 3.8–10.8)

## 2022-02-22 LAB — COMPLETE METABOLIC PANEL WITH GFR
AG Ratio: 1.2 (calc) (ref 1.0–2.5)
ALT: 18 U/L (ref 6–29)
AST: 18 U/L (ref 10–30)
Albumin: 4 g/dL (ref 3.6–5.1)
Alkaline phosphatase (APISO): 75 U/L (ref 31–125)
BUN: 9 mg/dL (ref 7–25)
CO2: 27 mmol/L (ref 20–32)
Calcium: 9.3 mg/dL (ref 8.6–10.2)
Chloride: 107 mmol/L (ref 98–110)
Creat: 0.9 mg/dL (ref 0.50–0.97)
Globulin: 3.4 g/dL (calc) (ref 1.9–3.7)
Glucose, Bld: 91 mg/dL (ref 65–139)
Potassium: 4.4 mmol/L (ref 3.5–5.3)
Sodium: 138 mmol/L (ref 135–146)
Total Bilirubin: 0.2 mg/dL (ref 0.2–1.2)
Total Protein: 7.4 g/dL (ref 6.1–8.1)
eGFR: 84 mL/min/{1.73_m2} (ref >=60)

## 2022-02-22 LAB — HEMOGLOBIN A1C
Hgb A1c MFr Bld: 5.7 % of total Hgb — ABNORMAL HIGH (ref <5.7)
Mean Plasma Glucose: 117 mg/dL
eAG (mmol/L): 6.5 mmol/L

## 2022-02-22 LAB — LIPID PANEL
Cholesterol: 175 mg/dL (ref <200)
HDL: 53 mg/dL (ref >=50)
LDL Cholesterol (Calc): 98 mg/dL (calc)
Non-HDL Cholesterol (Calc): 122 mg/dL (calc) (ref <130)
Total CHOL/HDL Ratio: 3.3 (calc) (ref <5.0)
Triglycerides: 142 mg/dL (ref <150)

## 2022-09-21 ENCOUNTER — Other Ambulatory Visit: Payer: Self-pay | Admitting: Internal Medicine

## 2022-09-21 NOTE — Telephone Encounter (Signed)
Requested Prescriptions  Pending Prescriptions Disp Refills  . JUNEL 1.5/30 1.5-30 MG-MCG tablet [Pharmacy Med Name: JUNEL 1.5 MG-30 MCG TABLET] 84 tablet 0    Sig: TAKE 1 TABLET BY MOUTH EVERY DAY     OB/GYN:  Contraceptives Passed - 09/21/2022  3:25 AM      Passed - Last BP in normal range    BP Readings from Last 1 Encounters:  02/21/22 139/72         Passed - Valid encounter within last 12 months    Recent Outpatient Visits          7 months ago Encounter for general adult medical examination with abnormal findings   [place], NP             Passed - Patient is not a smoker

## 2022-10-24 ENCOUNTER — Telehealth: Payer: Self-pay

## 2022-10-24 NOTE — Telephone Encounter (Signed)
Apt for 10/27/2022   Thanks,   -Vernona Rieger

## 2022-10-24 NOTE — Telephone Encounter (Signed)
She will need an appt for this.  

## 2022-10-24 NOTE — Telephone Encounter (Signed)
Copied from CRM [id number]. Topic: General - Other >> Oct 24, 2022 10:05 AM Everette C wrote: Reason for CRM: The patient has been directed by their dermatologist to request lab orders from their PCP to check their insulin resistance  Please contact the patient further when possible  The patient has scheduled an appt for 10/27/22

## 2022-10-27 ENCOUNTER — Ambulatory Visit: Payer: 59 | Admitting: Internal Medicine

## 2022-10-27 ENCOUNTER — Encounter: Payer: Self-pay | Admitting: Internal Medicine

## 2022-10-27 VITALS — BP 136/82 | HR 88 | Temp 97.3°F | Wt 268.0 lb

## 2022-10-27 DIAGNOSIS — Z6841 Body Mass Index (BMI) 40.0 and over, adult: Secondary | ICD-10-CM

## 2022-10-27 DIAGNOSIS — N760 Acute vaginitis: Secondary | ICD-10-CM

## 2022-10-27 DIAGNOSIS — L732 Hidradenitis suppurativa: Secondary | ICD-10-CM | POA: Diagnosis not present

## 2022-10-27 DIAGNOSIS — R7303 Prediabetes: Secondary | ICD-10-CM

## 2022-10-27 DIAGNOSIS — E66813 Obesity, class 3: Secondary | ICD-10-CM

## 2022-10-27 LAB — POCT GLYCOSYLATED HEMOGLOBIN (HGB A1C): Hemoglobin A1C: 5.6 % (ref 4.0–5.6)

## 2022-10-27 MED ORDER — METFORMIN HCL 500 MG PO TABS: 500.0000 mg | ORAL_TABLET | Freq: Every day | ORAL | 1 refills | Status: DC

## 2022-10-27 NOTE — Patient Instructions (Signed)

## 2022-10-27 NOTE — Progress Notes (Signed)
Subjective:    Patient ID: Kaitlyn Kennedy, female    DOB: 1981-11-08, 41 y.o.   MRN: [id number]  HPI  Patient presents to clinic today for follow-up of chronic conditions.  Prediabetes: Her last A1c was 5.7%, 02/2022.  She is not taking any oral diabetic medication at this time.  She does not check her sugars.  Hidradenitis Suppurativa: Managed with Humira. She uses Clindamycin gel and Metrogel daily.  She follows with dermatology.  Recurrent Vaginitis: She takes Ketoconazole as needed.  Review of Systems     Past Medical History:  Diagnosis Date   No pertinent past medical history     Current Outpatient Medications  Medication Sig Dispense Refill   clindamycin (CLEOCIN) 300 MG capsule Take 1 capsule (300 mg total) by mouth daily. 90 capsule 0   clindamycin (CLINDAGEL) 1 % gel Apply topically 2 (two) times daily. 60 g 0   fluticasone (FLONASE) 50 MCG/ACT nasal spray Place 2 sprays into both nostrils daily. 16 g 6   HUMIRA PEN 40 MG/0.4ML PNKT SMARTSIG:40 Milligram(s) SUB-Q Once a Week     ibuprofen (ADVIL) 800 MG tablet Take 1 tablet (800 mg total) by mouth every 8 (eight) hours as needed. 90 tablet 0   JUNEL 1.5/30 1.5-30 MG-MCG tablet TAKE 1 TABLET BY MOUTH EVERY DAY 84 tablet 0   ketoconazole (NIZORAL) 200 MG tablet TAKE 1/2 TABLET BY MOUTH DAILY 30 tablet 0   ZYRTEC ALLERGY 10 MG CAPS SMARTSIG:1 Capsule(s) By Mouth     No current facility-administered medications for this visit.    No Known Allergies  Family History  Problem Relation Age of Onset   Hypertension Mother    Hyperlipidemia Mother    Hypertension Father    Diabetes Father    Hyperlipidemia Father    Heart disease Brother    Sarcoidosis Brother    Breast cancer Maternal Grandmother    Prostate cancer Maternal Grandfather    Cervical cancer Paternal Grandmother    Asthma Paternal Grandfather    Emphysema Paternal Grandfather     Social History   Socioeconomic History   Marital status: Married     Spouse name: Not on file   Number of children: Not on file   Years of education: Not on file   Highest education level: Not on file  Occupational History   Not on file  Tobacco Use   Smoking status: Former   Smokeless tobacco: Never  Vaping Use   Vaping Use: Some days  Substance and Sexual Activity   Alcohol use: No   Drug use: No   Sexual activity: Yes    Partners: Male    Birth control/protection: Pill  Other Topics Concern   Not on file  Social History Narrative   Not on file   Social Determinants of Health   Financial Resource Strain: Not on file  Food Insecurity: Not on file  Transportation Needs: Not on file  Physical Activity: Not on file  Stress: Not on file  Social Connections: Not on file  Intimate Partner Violence: Not on file     Constitutional: Denies fever, malaise, fatigue, headache or abrupt weight changes.  HEENT: Denies eye pain, eye redness, ear pain, ringing in the ears, wax buildup, runny nose, nasal congestion, bloody nose, or sore throat. Respiratory: Denies difficulty breathing, shortness of breath, cough or sputum production.   Cardiovascular: Denies chest pain, chest tightness, palpitations or swelling in the hands or feet.  Gastrointestinal: Denies abdominal pain, bloating, constipation, diarrhea  or blood in the stool.  GU: Denies urgency, frequency, pain with urination, burning sensation, blood in urine, odor or discharge. Musculoskeletal: Denies decrease in range of motion, difficulty with gait, muscle pain or joint pain and swelling.  Skin: Patient reports recurrent boils.  Denies redness, rashes, or ulcercations.  Neurological: Denies dizziness, difficulty with memory, difficulty with speech or problems with balance and coordination.  Psych: Denies anxiety, depression, SI/HI.  No other specific complaints in a complete review of systems (except as listed in HPI above).  Objective:   Physical Exam  There were no vitals taken for this  visit. Wt Readings from Last 3 Encounters:  02/21/22 254 lb (115.2 kg)  02/14/21 262 lb (118.8 kg)  01/01/20 250 lb (113.4 kg)    General: Appears her stated age, obese in NAD. Skin: Warm, dry and intact. Scarring noted under breast. Cardiovascular: Normal rate and rhythm. S1,S2 noted.  No murmur, rubs or gallops noted.  Pulmonary/Chest: Normal effort and positive vesicular breath sounds. No respiratory distress. No wheezes, rales or ronchi noted.  Musculoskeletal: No difficulty with gait.  Neurological: Alert and oriented.  Psychiatric: Mood and affect normal. Behavior is normal. Judgment and thought content normal.     BMET    Component Value Date/Time   NA 138 02/21/2022 0951   K 4.4 02/21/2022 0951   CL 107 02/21/2022 0951   CO2 27 02/21/2022 0951   GLUCOSE 91 02/21/2022 0951   BUN 9 02/21/2022 0951   CREATININE 0.90 02/21/2022 0951   CALCIUM 9.3 02/21/2022 0951    Lipid Panel     Component Value Date/Time   CHOL 175 02/21/2022 0951   TRIG 142 02/21/2022 0951   HDL 53 02/21/2022 0951   CHOLHDL 3.3 02/21/2022 0951   VLDL 20.6 02/14/2021 1255   LDLCALC 98 02/21/2022 0951    CBC    Component Value Date/Time   WBC 11.2 (H) 02/21/2022 0951   RBC 4.47 02/21/2022 0951   HGB 12.9 02/21/2022 0951   HCT 39.1 02/21/2022 0951   PLT 360 02/21/2022 0951   MCV 87.5 02/21/2022 0951   MCH 28.9 02/21/2022 0951   MCHC 33.0 02/21/2022 0951   RDW 12.9 02/21/2022 0951   LYMPHSABS 2.5 03/14/2017 0929   MONOABS 0.7 03/14/2017 0929   EOSABS 0.3 03/14/2017 0929   BASOSABS 0.1 03/14/2017 0929    Hgb A1C Lab Results  Component Value Date   HGBA1C 5.7 (H) 02/21/2022            Assessment & Plan:     RTC in 6 months for your annual exam Nicki Reaper, NP

## 2022-10-27 NOTE — Assessment & Plan Note (Signed)
Continue ketoconazole as needed

## 2022-10-27 NOTE — Assessment & Plan Note (Signed)
Continue Humira, clindamycin and metronidazole gel per dermatology

## 2022-10-27 NOTE — Assessment & Plan Note (Signed)
We will trial metformin for weight loss given her insulin resistance Encouraged diet and exercise for weight loss

## 2022-10-27 NOTE — Assessment & Plan Note (Signed)
POCT A1c 5.6% We will trial metformin for insulin resistance and weight loss Encourage low-carb diet

## 2022-12-08 ENCOUNTER — Ambulatory Visit: Payer: 59 | Admitting: Internal Medicine

## 2022-12-08 ENCOUNTER — Encounter: Payer: Self-pay | Admitting: Internal Medicine

## 2022-12-08 VITALS — BP 118/78 | HR 84 | Temp 98.7°F | Resp 18 | Ht 67.5 in | Wt 269.0 lb

## 2022-12-08 DIAGNOSIS — J189 Pneumonia, unspecified organism: Secondary | ICD-10-CM

## 2022-12-08 MED ORDER — AZITHROMYCIN 250 MG PO TABS: ORAL_TABLET | ORAL | 0 refills | Status: DC

## 2022-12-08 MED ORDER — HYDROCODONE BIT-HOMATROP MBR 5-1.5 MG/5ML PO SOLN: 5.0000 mL | Freq: Three times a day (TID) | ORAL | 0 refills | Status: DC | PRN

## 2022-12-08 NOTE — Patient Instructions (Signed)

## 2022-12-08 NOTE — Addendum Note (Signed)
Addended by: Lorre Munroe on: 12/08/2022 02:37 PM   Modules accepted: Orders

## 2022-12-08 NOTE — Progress Notes (Signed)
Subjective:    Patient ID: Kaitlyn Kennedy, female    DOB: 09-18-1981, 41 y.o.   MRN: [id number]  HPI  Patient presents to clinic today with complaint of cough and wheezing.  This started 2 weeks ago with flulike symptoms which improved after about a week.  She has a persistent cough that is mostly nonproductive.  The cough is worse at night, keeping her awake.  She denies headache, runny nose, nasal congestion, ear pain, sore throat, chest pain, nausea, vomiting or diarrhea.  She denies fever, chills or body aches.  She has tried Albuterol and cough syrup OTC with minimal relief of symptoms.  Review of Systems     Past Medical History:  Diagnosis Date   No pertinent past medical history     Current Outpatient Medications  Medication Sig Dispense Refill   Adalimumab (HUMIRA PEN) 80 MG/0.8ML PNKT Inject into the skin.     clindamycin (CLEOCIN) 300 MG capsule Take 1 capsule (300 mg total) by mouth daily. 90 capsule 0   clindamycin (CLINDAGEL) 1 % gel Apply topically 2 (two) times daily. 60 g 0   fluticasone (FLONASE) 50 MCG/ACT nasal spray Place 2 sprays into both nostrils daily. 16 g 6   ibuprofen (ADVIL) 800 MG tablet Take 1 tablet (800 mg total) by mouth every 8 (eight) hours as needed. 90 tablet 0   JUNEL 1.5/30 1.5-30 MG-MCG tablet TAKE 1 TABLET BY MOUTH EVERY DAY 84 tablet 0   ketoconazole (NIZORAL) 200 MG tablet TAKE 1/2 TABLET BY MOUTH DAILY 30 tablet 0   metFORMIN (GLUCOPHAGE) 500 MG tablet Take 1 tablet (500 mg total) by mouth daily with breakfast. 90 tablet 1   metroNIDAZOLE (METROGEL) 0.75 % gel Apply 1 Application topically at bedtime.     ZYRTEC ALLERGY 10 MG CAPS SMARTSIG:1 Capsule(s) By Mouth     No current facility-administered medications for this visit.    No Known Allergies  Family History  Problem Relation Age of Onset   Hypertension Mother    Hyperlipidemia Mother    Hypertension Father    Diabetes Father    Hyperlipidemia Father    Heart disease  Brother    Sarcoidosis Brother    Breast cancer Maternal Grandmother    Prostate cancer Maternal Grandfather    Cervical cancer Paternal Grandmother    Asthma Paternal Grandfather    Emphysema Paternal Grandfather     Social History   Socioeconomic History   Marital status: Married    Spouse name: Not on file   Number of children: Not on file   Years of education: Not on file   Highest education level: Not on file  Occupational History   Not on file  Tobacco Use   Smoking status: Former   Smokeless tobacco: Never  Vaping Use   Vaping Use: Some days  Substance and Sexual Activity   Alcohol use: No   Drug use: No   Sexual activity: Yes    Partners: Male    Birth control/protection: Pill  Other Topics Concern   Not on file  Social History Narrative   Not on file   Social Determinants of Health   Financial Resource Strain: Not on file  Food Insecurity: Not on file  Transportation Needs: Not on file  Physical Activity: Not on file  Stress: Not on file  Social Connections: Not on file  Intimate Partner Violence: Not on file     Constitutional: Denies fever, malaise, fatigue, headache or abrupt weight changes.  HEENT: Denies eye pain, eye redness, ear pain, ringing in the ears, wax buildup, runny nose, nasal congestion, bloody nose, or sore throat. Respiratory: Patient reports cough.  Denies difficulty breathing, shortness of breath, or sputum production.   Cardiovascular: Denies chest pain, chest tightness, palpitations or swelling in the hands or feet.  Gastrointestinal: Denies abdominal pain, bloating, constipation, diarrhea or blood in the stool.   No other specific complaints in a complete review of systems (except as listed in HPI above).  Objective:   Physical Exam  BP 118/78 (BP Location: Left Arm, Patient Position: Sitting, Cuff Size: Large)   Pulse 84   Temp 98.7 F (37.1 C) (Oral)   Resp 18   Ht 5' 7.5" (1.715 m)   Wt 269 lb (122 kg)   SpO2 100%    BMI 41.51 kg/m   Wt Readings from Last 3 Encounters:  10/27/22 268 lb (121.6 kg)  02/21/22 254 lb (115.2 kg)  02/14/21 262 lb (118.8 kg)    General: Appears her stated age, obese in NAD. Skin: Warm, dry and intact. No rashes, lesions or ulcerations noted. HEENT: Head: normal shape and size; Eyes: sclera white, no icterus, conjunctiva pink, PERRLA and EOMs intact;  Neck: No adenopathy noted. Cardiovascular: Normal rate and rhythm. S1,S2 noted.  No murmur, rubs or gallops noted.  Pulmonary/Chest: Normal effort and positive vesicular breath sounds with coarse rhonchi and wheezing R >L.Marland Kitchen No respiratory distress. No rales noted.  Neurological: Alert and oriented.  BMET    Component Value Date/Time   NA 138 02/21/2022 0951   K 4.4 02/21/2022 0951   CL 107 02/21/2022 0951   CO2 27 02/21/2022 0951   GLUCOSE 91 02/21/2022 0951   BUN 9 02/21/2022 0951   CREATININE 0.90 02/21/2022 0951   CALCIUM 9.3 02/21/2022 0951    Lipid Panel     Component Value Date/Time   CHOL 175 02/21/2022 0951   TRIG 142 02/21/2022 0951   HDL 53 02/21/2022 0951   CHOLHDL 3.3 02/21/2022 0951   VLDL 20.6 02/14/2021 1255   LDLCALC 98 02/21/2022 0951    CBC    Component Value Date/Time   WBC 11.2 (H) 02/21/2022 0951   RBC 4.47 02/21/2022 0951   HGB 12.9 02/21/2022 0951   HCT 39.1 02/21/2022 0951   PLT 360 02/21/2022 0951   MCV 87.5 02/21/2022 0951   MCH 28.9 02/21/2022 0951   MCHC 33.0 02/21/2022 0951   RDW 12.9 02/21/2022 0951   LYMPHSABS 2.5 03/14/2017 0929   MONOABS 0.7 03/14/2017 0929   EOSABS 0.3 03/14/2017 0929   BASOSABS 0.1 03/14/2017 0929    Hgb A1C Lab Results  Component Value Date   HGBA1C 5.6 10/27/2022            Assessment & Plan:   Community Acquired Pneumonia:  Rx for Azithromycin 250 mg x 5 days She declines Rx for Pred taper at this time Continue Albuterol as needed Rx for Hycodan cough syrup as needed for cough  RTC in 5 months for annual exam Nicki Reaper,  NP

## 2022-12-29 ENCOUNTER — Other Ambulatory Visit: Payer: Self-pay | Admitting: Internal Medicine

## 2022-12-29 MED ORDER — HYDROCODONE BIT-HOMATROP MBR 5-1.5 MG/5ML PO SOLN: 5.0000 mL | Freq: Three times a day (TID) | ORAL | 0 refills | Status: DC | PRN

## 2023-01-13 ENCOUNTER — Other Ambulatory Visit: Payer: Self-pay | Admitting: Internal Medicine

## 2023-01-15 NOTE — Telephone Encounter (Signed)
Requested medication (s) are due for refill today: yes  Requested medication (s) are on the active medication list: yes  Last refill:  09/21/22 #84 0  refills.  Future visit scheduled: no   Notes to clinic:  no refills remain. Do you want to refill Rx?     Requested Prescriptions  Pending Prescriptions Disp Refills   JUNEL 1.5/30 1.5-30 MG-MCG tablet [Pharmacy Med Name: JUNEL 1.5 MG-30 MCG TABLET] 84 tablet 0    Sig: TAKE 1 TABLET BY MOUTH EVERY DAY     OB/GYN:  Contraceptives Passed - 01/13/2023  8:28 AM      Passed - Last BP in normal range    BP Readings from Last 1 Encounters:  12/08/22 118/78         Passed - Valid encounter within last 12 months    Recent Outpatient Visits           1 month ago Community acquired pneumonia of right lower lobe of lung   [place], NP   2 months ago [facility], Coralie Keens, NP   10 months ago Encounter for general adult medical examination with abnormal findings   [place], [place]              Passed - Patient is not a smoker

## 2023-03-26 ENCOUNTER — Other Ambulatory Visit: Payer: Self-pay | Admitting: Internal Medicine

## 2023-03-26 MED ORDER — METHOCARBAMOL 500 MG PO TABS: 500.0000 mg | ORAL_TABLET | Freq: Three times a day (TID) | ORAL | 0 refills | Status: DC | PRN

## 2023-03-26 MED ORDER — PREDNISONE 10 MG PO TABS: ORAL_TABLET | ORAL | 0 refills | Status: DC

## 2024-02-05 ENCOUNTER — Ambulatory Visit: Payer: Self-pay | Admitting: Internal Medicine

## 2024-02-06 ENCOUNTER — Other Ambulatory Visit: Payer: Self-pay | Admitting: *Deleted

## 2024-02-06 ENCOUNTER — Encounter: Payer: Self-pay | Admitting: Internal Medicine

## 2024-02-06 ENCOUNTER — Inpatient Hospital Stay
Admission: RE | Admit: 2024-02-06 | Discharge: 2024-02-06 | Disposition: A | Discharge status: Home / Self Care | Payer: Self-pay | Source: Ambulatory Visit | Attending: Internal Medicine | Admitting: Internal Medicine

## 2024-02-06 ENCOUNTER — Ambulatory Visit (INDEPENDENT_AMBULATORY_CARE_PROVIDER_SITE_OTHER): Payer: 59 | Admitting: Internal Medicine

## 2024-02-06 VITALS — BP 124/82 | Ht 67.5 in | Wt 246.0 lb

## 2024-02-06 DIAGNOSIS — N6311 Unspecified lump in the right breast, upper outer quadrant: Secondary | ICD-10-CM

## 2024-02-06 DIAGNOSIS — Z803 Family history of malignant neoplasm of breast: Secondary | ICD-10-CM

## 2024-02-06 DIAGNOSIS — Z1231 Encounter for screening mammogram for malignant neoplasm of breast: Secondary | ICD-10-CM

## 2024-02-06 NOTE — Addendum Note (Signed)
 Addended by: Lorre Munroe on: 02/06/2024 09:20 AM   Modules accepted: Orders

## 2024-02-06 NOTE — Addendum Note (Signed)
 Addended by: Lorre Munroe on: 02/06/2024 10:02 AM   Modules accepted: Orders

## 2024-02-06 NOTE — Progress Notes (Signed)
 Subjective:    Patient ID: Kaitlyn Kennedy, female    DOB: September 22, 1981, 43 y.o.   MRN: [id number]  HPI  Patient presents to clinic today with complaint of a mass in her right breast.  She noticed this yesterday.  The mass is not tender and she does not think it has gotten bigger in size.  She has not noticed any changes in the skin of the breast but does have a history of hidradenitis suppurativa.  She follows with dermatology for this.  She has multiple incidences of breast cancer in her family.  Review of Systems     Past Medical History:  Diagnosis Date   No pertinent past medical history     Current Outpatient Medications  Medication Sig Dispense Refill   methocarbamol (ROBAXIN) 500 MG tablet Take 1 tablet (500 mg total) by mouth every 8 (eight) hours as needed for muscle spasms. 15 tablet 0   predniSONE (DELTASONE) 10 MG tablet Take 6 tabs on day 1, 5 tabs on day 2, 4 tabs on day 3, 3 tabs on day 4, 2 tabs on day 5, 1 tab on day 6 21 tablet 0   Adalimumab (HUMIRA PEN) 80 MG/0.8ML PNKT Inject into the skin.     azithromycin (ZITHROMAX) 250 MG tablet Take 2 tabs today, then 1 tab daily x 4 days 6 tablet 0   clindamycin (CLEOCIN) 300 MG capsule Take 1 capsule (300 mg total) by mouth daily. 90 capsule 0   clindamycin (CLINDAGEL) 1 % gel Apply topically 2 (two) times daily. 60 g 0   fluticasone (FLONASE) 50 MCG/ACT nasal spray Place 2 sprays into both nostrils daily. 16 g 6   HYDROcodone bit-homatropine (HYCODAN) 5-1.5 MG/5ML syrup Take 5 mLs by mouth every 8 (eight) hours as needed for cough. 120 mL 0   ibuprofen (ADVIL) 800 MG tablet Take 1 tablet (800 mg total) by mouth every 8 (eight) hours as needed. 90 tablet 0   JUNEL 1.5/30 1.5-30 MG-MCG tablet TAKE 1 TABLET BY MOUTH EVERY DAY 84 tablet 0   ketoconazole (NIZORAL) 200 MG tablet TAKE 1/2 TABLET BY MOUTH DAILY 30 tablet 0   metFORMIN (GLUCOPHAGE) 500 MG tablet Take 1 tablet (500 mg total) by mouth daily with breakfast. 90 tablet 1    metroNIDAZOLE (METROGEL) 0.75 % gel Apply 1 Application topically at bedtime.     ZYRTEC ALLERGY 10 MG CAPS SMARTSIG:1 Capsule(s) By Mouth     No current facility-administered medications for this visit.    No Known Allergies  Family History  Problem Relation Age of Onset   Hypertension Mother    Hyperlipidemia Mother    Hypertension Father    Diabetes Father    Hyperlipidemia Father    Heart disease Brother    Sarcoidosis Brother    Breast cancer Maternal Grandmother    Prostate cancer Maternal Grandfather    Cervical cancer Paternal Grandmother    Asthma Paternal Grandfather    Emphysema Paternal Grandfather     Social History   Socioeconomic History   Marital status: Married    Spouse name: Not on file   Number of children: Not on file   Years of education: Not on file   Highest education level: Not on file  Occupational History   Not on file  Tobacco Use   Smoking status: Former   Smokeless tobacco: Never  Vaping Use   Vaping status: Some Days  Substance and Sexual Activity   Alcohol use: No  Drug use: No   Sexual activity: Yes    Partners: Male    Birth control/protection: Pill  Other Topics Concern   Not on file  Social History Narrative   Not on file   Social Drivers of Health   Financial Resource Strain: Not on file  Food Insecurity: Not on file  Transportation Needs: Not on file  Physical Activity: Not on file  Stress: Not on file  Social Connections: Not on file  Intimate Partner Violence: Not on file     Constitutional: Denies fever, malaise, fatigue, headache or abrupt weight changes.  Respiratory: Denies difficulty breathing, shortness of breath, cough or sputum production.   Cardiovascular: Denies chest pain, chest tightness, palpitations or swelling in the hands or feet.  Skin: Patient reports recurring boils under the breast and in the groin, mass of right breast.  Denies ulcercations.  Neurological: Denies dizziness, difficulty  with memory, difficulty with speech or problems with balance and coordination.    No other specific complaints in a complete review of systems (except as listed in HPI above).  Objective:   Physical Exam   BP 124/82 (BP Location: Left Arm, Patient Position: Sitting, Cuff Size: Large)   Ht 5' 7.5" (1.715 m)   Wt 246 lb (111.6 kg)   LMP 01/24/2024 (Exact Date)   BMI 37.96 kg/m    Wt Readings from Last 3 Encounters:  12/08/22 269 lb (122 kg)  10/27/22 268 lb (121.6 kg)  02/21/22 254 lb (115.2 kg)    General: Appears her stated age, obese, in NAD. Breast: Symmetrical.  Multiple areas of hidradenitis suppurativa, draining boils noted underneath the breast.  3 cm x 3 cm nodular mass noted at 11:00 in the right breast.  Slight dimpling of the skin in that area.  No discharge noted from the nipple.  No axillary adenopathy noted. Cardiovascular: Normal rate and rhythm.  Pulmonary/Chest: Normal effort and positive vesicular breath sounds. No respiratory distress. No wheezes, rales or ronchi noted.  Neurological: Alert and oriented.  Psychiatric: Anxious and tearful.   BMET    Component Value Date/Time   NA 138 02/21/2022 0951   K 4.4 02/21/2022 0951   CL 107 02/21/2022 0951   CO2 27 02/21/2022 0951   GLUCOSE 91 02/21/2022 0951   BUN 9 02/21/2022 0951   CREATININE 0.90 02/21/2022 0951   CALCIUM 9.3 02/21/2022 0951    Lipid Panel     Component Value Date/Time   CHOL 175 02/21/2022 0951   TRIG 142 02/21/2022 0951   HDL 53 02/21/2022 0951   CHOLHDL 3.3 02/21/2022 0951   VLDL 20.6 02/14/2021 1255   LDLCALC 98 02/21/2022 0951    CBC    Component Value Date/Time   WBC 11.2 (H) 02/21/2022 0951   RBC 4.47 02/21/2022 0951   HGB 12.9 02/21/2022 0951   HCT 39.1 02/21/2022 0951   PLT 360 02/21/2022 0951   MCV 87.5 02/21/2022 0951   MCH 28.9 02/21/2022 0951   MCHC 33.0 02/21/2022 0951   RDW 12.9 02/21/2022 0951   LYMPHSABS 2.5 03/14/2017 0929   MONOABS 0.7 03/14/2017 0929    EOSABS 0.3 03/14/2017 0929   BASOSABS 0.1 03/14/2017 0929    Hgb A1C Lab Results  Component Value Date   HGBA1C 5.6 10/27/2022          Assessment & Plan:   Mass of right breast, family history of breast cancer:  Will obtain stat diagnostic mammogram of bilateral breast Will obtain ultrasound of right breast  Schedule an appointment for your annual exam Nicki Reaper, NP

## 2024-02-06 NOTE — Patient Instructions (Signed)
 Breast Self-Awareness Breast self-awareness is knowing how your breasts look and feel. You need to: Check your breasts on a regular basis. Tell your doctor about any changes. Become familiar with the look and feel of your breasts. This can help you catch a breast problem while it is still small and can be treated. You should do breast self-exams even if you have breast implants. What you need: A mirror. A well-lit room. A pillow or other soft object. How to do a breast self-exam Follow these steps to do a breast self-exam: Look for changes  Take off all the clothes above your waist. Stand in front of a mirror in a room with good lighting. Put your hands down at your sides. Compare your breasts in the mirror. Look for any difference between them, such as: A difference in shape. A difference in size. Wrinkles, dips, and bumps in one breast and not the other. Look at each breast for changes in the skin, such as: Redness. Scaly areas. Skin that has gotten thicker. Dimpling. Open sores (ulcers). Look for changes in your nipples, such as: Fluid coming out of a nipple. Fluid around a nipple. Bleeding. Dimpling. Redness. A nipple that looks pushed in (retracted), or that has changed position. Feel for changes Lie on your back. Feel each breast. To do this: Pick a breast to feel. Place a pillow under the shoulder closest to that breast. Put the arm closest to that breast behind your head. Feel the nipple area of that breast using the hand of your other arm. Feel the area with the pads of your three middle fingers by making small circles with your fingers. Use light, medium, and firm pressure. Continue the overlapping circles, moving downward over the breast. Keep making circles with your fingers. Stop when you feel your ribs. Start making circles with your fingers again, this time going upward until you reach your collarbone. Then, make circles outward across your breast and into your  armpit area. Squeeze your nipple. Check for discharge and lumps. Repeat these steps to check your other breast. Sit or stand in the tub or shower. With soapy water on your skin, feel each breast the same way you did when you were lying down. Write down what you find Writing down what you find can help you remember what to tell your doctor. Write down: What is normal for each breast. Any changes you find in each breast. These include: The kind of changes you find. A tender or painful breast. Any lump you find. Write down its size and where it is. When you last had your monthly period (menstrual cycle). General tips If you are breastfeeding, the best time to check your breasts is after you feed your baby or after you use a breast pump. If you get monthly bleeding, the best time to check your breasts is 5-7 days after your monthly cycle ends. With time, you will become comfortable with the self-exam. You will also start to know if there are changes in your breasts. Contact a doctor if: You see a change in the shape or size of your breasts or nipples. You see a change in the skin of your breast or nipples, such as red or scaly skin. You have fluid coming from your nipples that is not normal. You find a new lump or thick area. You have breast pain. You have any concerns about your breast health. Summary Breast self-awareness includes looking for changes in your breasts and feeling for changes  within your breasts. You should do breast self-awareness in front of a mirror in a well-lit room. If you get monthly periods (menstrual cycles), the best time to check your breasts is 5-7 days after your period ends. Tell your doctor about any changes you see in your breasts. Changes include changes in size, changes on the skin, painful or tender breasts, or fluid from your nipples that is not normal. This information is not intended to replace advice given to you by your health care provider. Make sure  you discuss any questions you have with your health care provider. Document Revised: 05/04/2022 Document Reviewed: 09/29/2021 Elsevier Patient Education  2024 [employer].

## 2024-02-07 ENCOUNTER — Ambulatory Visit: Payer: 59 | Admitting: Internal Medicine

## 2024-02-08 ENCOUNTER — Telehealth: Payer: Self-pay

## 2024-02-08 NOTE — Telephone Encounter (Signed)
 Mammogram results

## 2024-02-12 ENCOUNTER — Other Ambulatory Visit: Payer: Self-pay

## 2024-02-14 LAB — SURGICAL PATHOLOGY

## 2024-02-22 ENCOUNTER — Ambulatory Visit (INDEPENDENT_AMBULATORY_CARE_PROVIDER_SITE_OTHER): Admitting: Internal Medicine

## 2024-02-22 ENCOUNTER — Other Ambulatory Visit (HOSPITAL_COMMUNITY)
Admission: RE | Admit: 2024-02-22 | Discharge: 2024-02-22 | Disposition: A | Discharge status: Home / Self Care | Source: Ambulatory Visit | Attending: Internal Medicine | Admitting: Internal Medicine

## 2024-02-22 ENCOUNTER — Encounter: Payer: Self-pay | Admitting: Internal Medicine

## 2024-02-22 VITALS — BP 128/84 | Ht 67.5 in | Wt 248.2 lb

## 2024-02-22 DIAGNOSIS — Z0001 Encounter for general adult medical examination with abnormal findings: Secondary | ICD-10-CM | POA: Diagnosis not present

## 2024-02-22 DIAGNOSIS — L732 Hidradenitis suppurativa: Secondary | ICD-10-CM

## 2024-02-22 DIAGNOSIS — Z113 Encounter for screening for infections with a predominantly sexual mode of transmission: Secondary | ICD-10-CM

## 2024-02-22 DIAGNOSIS — R7303 Prediabetes: Secondary | ICD-10-CM | POA: Diagnosis not present

## 2024-02-22 DIAGNOSIS — N62 Hypertrophy of breast: Secondary | ICD-10-CM

## 2024-02-22 DIAGNOSIS — Z136 Encounter for screening for cardiovascular disorders: Secondary | ICD-10-CM | POA: Diagnosis not present

## 2024-02-22 DIAGNOSIS — Z6838 Body mass index (BMI) 38.0-38.9, adult: Secondary | ICD-10-CM

## 2024-02-22 DIAGNOSIS — E6609 Other obesity due to excess calories: Secondary | ICD-10-CM

## 2024-02-22 DIAGNOSIS — E66812 Obesity, class 2: Secondary | ICD-10-CM

## 2024-02-22 MED ORDER — METFORMIN HCL 500 MG PO TABS
500.0000 mg | ORAL_TABLET | Freq: Every day | ORAL | 1 refills | Status: DC
Start: 2024-02-22 — End: 2024-04-28

## 2024-02-22 MED ORDER — SPIRONOLACTONE 100 MG PO TABS: 100.0000 mg | ORAL_TABLET | Freq: Every day | ORAL | 1 refills | Status: AC

## 2024-02-22 MED ORDER — NORETHINDRONE ACET-ETHINYL EST 1.5-30 MG-MCG PO TABS
1.0000 | ORAL_TABLET | Freq: Every day | ORAL | 1 refills | Status: DC
Start: 2024-02-22 — End: 2024-10-31

## 2024-02-22 NOTE — Assessment & Plan Note (Signed)
 Encouraged diet and exercise for weight loss ?

## 2024-02-22 NOTE — Progress Notes (Signed)
 Subjective:    Patient ID: Kaitlyn Kennedy, female    DOB: [birth year], 43 y.o.   MRN: [id number]  HPI  Patient presents to clinic today for her annual exam.    Flu: 09/2018 Tetanus: < 10 years ago COVID: Pfizer x2 Pap smear: 08/2017, GYN Dentist: annually  Diet: She does eat meat. She consumes fruits and veggies. She does eat some fried foods. She drinks lemonade Exercise: Walking 2 miles daily  Review of Systems     Past Medical History:  Diagnosis Date   No pertinent past medical history     Current Outpatient Medications  Medication Sig Dispense Refill   HYRIMOZ 80 MG/0.8ML SOAJ 80 mg.     ibuprofen (ADVIL) 800 MG tablet Take 1 tablet (800 mg total) by mouth every 8 (eight) hours as needed. 90 tablet 0   JUNEL 1.5/30 1.5-30 MG-MCG tablet TAKE 1 TABLET BY MOUTH EVERY DAY 84 tablet 0   metFORMIN (GLUCOPHAGE) 500 MG tablet Take 1 tablet (500 mg total) by mouth daily with breakfast. 90 tablet 1   methocarbamol (ROBAXIN) 500 MG tablet Take 1 tablet (500 mg total) by mouth every 8 (eight) hours as needed for muscle spasms. 15 tablet 0   metroNIDAZOLE (METROGEL) 0.75 % gel Apply 1 Application topically at bedtime.     predniSONE (DELTASONE) 10 MG tablet Take 6 tabs on day 1, 5 tabs on day 2, 4 tabs on day 3, 3 tabs on day 4, 2 tabs on day 5, 1 tab on day 6 21 tablet 0   ZYRTEC ALLERGY 10 MG CAPS SMARTSIG:1 Capsule(s) By Mouth (Patient not taking: Reported on 02/06/2024)     No current facility-administered medications for this visit.    No Known Allergies  Family History  Problem Relation Age of Onset   Hypertension Mother    Hyperlipidemia Mother    Hypertension Father    Diabetes Father    Hyperlipidemia Father    Heart disease Brother    Sarcoidosis Brother    Breast cancer Maternal Grandmother    Prostate cancer Maternal Grandfather    Cervical cancer Paternal Grandmother    Asthma Paternal Grandfather    Emphysema Paternal Grandfather     Social History    Socioeconomic History   Marital status: Married    Spouse name: Not on file   Number of children: Not on file   Years of education: Not on file   Highest education level: Not on file  Occupational History   Not on file  Tobacco Use   Smoking status: Former   Smokeless tobacco: Never  Vaping Use   Vaping status: Some Days  Substance and Sexual Activity   Alcohol use: No   Drug use: No   Sexual activity: Yes    Partners: Male    Birth control/protection: Pill  Other Topics Concern   Not on file  Social History Narrative   Not on file   Social Drivers of Health   Financial Resource Strain: Not on file  Food Insecurity: Not on file  Transportation Needs: Not on file  Physical Activity: Not on file  Stress: Not on file  Social Connections: Not on file  Intimate Partner Violence: Not on file     Constitutional: Denies fever, malaise, fatigue, headache or abrupt weight changes.  HEENT: Denies eye pain, eye redness, ear pain, ringing in the ears, wax buildup, runny nose, nasal congestion, bloody nose, or sore throat. Respiratory: Denies difficulty breathing, shortness of breath, cough or  sputum production.   Cardiovascular: Denies chest pain, chest tightness, palpitations or swelling in the hands or feet.  Gastrointestinal: Denies abdominal pain, bloating, constipation, diarrhea or blood in the stool.  GU: Denies urgency, frequency, pain with urination, burning sensation, blood in urine, odor or discharge. Musculoskeletal: Denies decrease in range of motion, difficulty with gait, muscle pain or joint pain and swelling.  Skin: Patient reports recurring boils under the breast and in the groin.  Denies ulcercations.  Neurological: Denies dizziness, difficulty with memory, difficulty with speech or problems with balance and coordination.  Psych: Denies anxiety, depression, SI/HI.  No other specific complaints in a complete review of systems (except as listed in HPI  above).  Objective:   Physical Exam BP 128/84 (BP Location: Left Arm, Patient Position: Sitting, Cuff Size: Large)   Ht 5' 7.5" (1.715 m)   Wt 248 lb 3.2 oz (112.6 kg)   LMP 02/18/2024 (Exact Date)   BMI 38.30 kg/m    Wt Readings from Last 3 Encounters:  02/06/24 246 lb (111.6 kg)  12/08/22 269 lb (122 kg)  10/27/22 268 lb (121.6 kg)    General: Appears her stated age, obese, in NAD. Skin: Warm, dry and intact.  Scarring noted under the breast and bilateral groins. HEENT: Head: normal shape and size; Eyes: sclera white and EOMs intact;  Neck:  Neck supple, trachea midline. No masses, lumps or thyromegaly present.  Cardiovascular: Normal rate and rhythm. S1,S2 noted.  No murmur, rubs or gallops noted. No JVD or BLE edema.  Pulmonary/Chest: Normal effort and positive vesicular breath sounds. No respiratory distress. No wheezes, rales or ronchi noted.  Abdomen: Soft and nontender. Normal bowel sounds.  Musculoskeletal: Strength 5/5 BUE/BLE.  No difficulty with gait.  Neurological: Alert and oriented. Cranial nerves II-XII grossly intact. Coordination normal.  Psychiatric: Mood and affect normal. Behavior is normal. Judgment and thought content normal.     BMET    Component Value Date/Time   NA 138 02/21/2022 0951   K 4.4 02/21/2022 0951   CL 107 02/21/2022 0951   CO2 27 02/21/2022 0951   GLUCOSE 91 02/21/2022 0951   BUN 9 02/21/2022 0951   CREATININE 0.90 02/21/2022 0951   CALCIUM 9.3 02/21/2022 0951    Lipid Panel     Component Value Date/Time   CHOL 175 02/21/2022 0951   TRIG 142 02/21/2022 0951   HDL 53 02/21/2022 0951   CHOLHDL 3.3 02/21/2022 0951   VLDL 20.6 02/14/2021 1255   LDLCALC 98 02/21/2022 0951    CBC    Component Value Date/Time   WBC 11.2 (H) 02/21/2022 0951   RBC 4.47 02/21/2022 0951   HGB 12.9 02/21/2022 0951   HCT 39.1 02/21/2022 0951   PLT 360 02/21/2022 0951   MCV 87.5 02/21/2022 0951   MCH 28.9 02/21/2022 0951   MCHC 33.0 02/21/2022  0951   RDW 12.9 02/21/2022 0951   LYMPHSABS 2.5 03/14/2017 0929   MONOABS 0.7 03/14/2017 0929   EOSABS 0.3 03/14/2017 0929   BASOSABS 0.1 03/14/2017 0929    Hgb A1C Lab Results  Component Value Date   HGBA1C 5.6 10/27/2022          Assessment & Plan:   Preventative Health Maintenance:  Flu shot declined Tetanus UTD per her report Encouraged her to get her COVID booster Pap smear due, she will schedule this with her GYN Encouraged her to consume a balanced diet and exercise regimen Advised her to see an eye doctor and dentist annually  We will check CBC, c-Met, lipid, A1c today  Screen for STD:   Will check HIV, RPR and wet prep for gonorrhea, chlamydia and trichomoniasis  RTC in 6 months, follow-up chronic conditions Nicki Reaper, NP

## 2024-02-22 NOTE — Patient Instructions (Signed)

## 2024-02-23 LAB — COMPLETE METABOLIC PANEL WITH GFR
AG Ratio: 1 (calc) (ref 1.0–2.5)
ALT: 14 U/L (ref 6–29)
AST: 16 U/L (ref 10–30)
Albumin: 3.8 g/dL (ref 3.6–5.1)
Alkaline phosphatase (APISO): 58 U/L (ref 31–125)
BUN: 9 mg/dL (ref 7–25)
CO2: 26 mmol/L (ref 20–32)
Calcium: 9.1 mg/dL (ref 8.6–10.2)
Chloride: 105 mmol/L (ref 98–110)
Creat: 0.78 mg/dL (ref 0.50–0.99)
Globulin: 3.8 g/dL — ABNORMAL HIGH (ref 1.9–3.7)
Glucose, Bld: 64 mg/dL — ABNORMAL LOW (ref 65–139)
Potassium: 3.9 mmol/L (ref 3.5–5.3)
Sodium: 139 mmol/L (ref 135–146)
Total Bilirubin: 0.3 mg/dL (ref 0.2–1.2)
Total Protein: 7.6 g/dL (ref 6.1–8.1)
eGFR: 98 mL/min/{1.73_m2} (ref >=60)

## 2024-02-23 LAB — CBC
HCT: 36.9 % (ref 35.0–45.0)
Hemoglobin: 11.9 g/dL (ref 11.7–15.5)
MCH: 28.4 pg (ref 27.0–33.0)
MCHC: 32.2 g/dL (ref 32.0–36.0)
MCV: 88.1 fL (ref 80.0–100.0)
MPV: 9.4 fL (ref 7.5–12.5)
Platelets: 449 10*3/uL — ABNORMAL HIGH (ref 140–400)
RBC: 4.19 10*6/uL (ref 3.80–5.10)
RDW: 13.1 % (ref 11.0–15.0)
WBC: 11.5 10*3/uL — ABNORMAL HIGH (ref 3.8–10.8)

## 2024-02-23 LAB — LIPID PANEL
Cholesterol: 197 mg/dL (ref <200)
HDL: 48 mg/dL — ABNORMAL LOW (ref >=50)
LDL Cholesterol (Calc): 123 mg/dL — ABNORMAL HIGH
Non-HDL Cholesterol (Calc): 149 mg/dL — ABNORMAL HIGH (ref <130)
Total CHOL/HDL Ratio: 4.1 (calc) (ref <5.0)
Triglycerides: 144 mg/dL (ref <150)

## 2024-02-23 LAB — HEMOGLOBIN A1C
Hgb A1c MFr Bld: 5.6 %{Hb} (ref <5.7)
Mean Plasma Glucose: 114 mg/dL
eAG (mmol/L): 6.3 mmol/L

## 2024-02-23 LAB — HIV ANTIBODY (ROUTINE TESTING W REFLEX): HIV 1&2 Ab, 4th Generation: NONREACTIVE

## 2024-02-23 LAB — RPR: RPR Ser Ql: NONREACTIVE

## 2024-02-25 ENCOUNTER — Encounter: Payer: Self-pay | Admitting: Internal Medicine

## 2024-02-26 LAB — CERVICOVAGINAL ANCILLARY ONLY
Chlamydia: NEGATIVE
Comment: NEGATIVE
Comment: NEGATIVE
Comment: NORMAL
Neisseria Gonorrhea: NEGATIVE
Trichomonas: NEGATIVE

## 2024-02-29 ENCOUNTER — Other Ambulatory Visit: Payer: Self-pay | Admitting: Internal Medicine

## 2024-02-29 ENCOUNTER — Telehealth: Payer: Self-pay

## 2024-02-29 MED ORDER — PHENTERMINE-TOPIRAMATE ER 3.75-23 MG PO CP24: 1.0000 | ORAL_CAPSULE | Freq: Every day | ORAL | 0 refills | Status: DC

## 2024-02-29 NOTE — Telephone Encounter (Signed)
 Kaitlyn Kennedy (Key: [id number]) PA Case ID #: [id number] Rx #: [id number] Need Help? Call us at [phone] Outcome Approved today by [facility] NCPDP 2017 Your PA request has been approved. Additional information will be provided in the approval communication. (Message 1145) Effective Date: 02/29/2024 Authorization Expiration Date: 05/29/2024 Drug Qsymia 3.75-23MG  er capsules ePA cloud Psychologist, educational Electronic PA Form [phone] NCPDP) Original Claim Info 75 PRIOR AUTH REQ-MD CALL [phone].DRUG REQUIRES PRIOR AUTHORIZATION

## 2024-03-13 ENCOUNTER — Other Ambulatory Visit: Payer: Self-pay

## 2024-03-13 ENCOUNTER — Other Ambulatory Visit: Payer: Self-pay | Admitting: Internal Medicine

## 2024-03-13 MED ORDER — ZYRTEC ALLERGY 10 MG PO CAPS
10.0000 mg | ORAL_CAPSULE | Freq: Every day | ORAL | 0 refills | Status: DC
Start: 2024-03-13 — End: 2024-03-13

## 2024-03-13 MED ORDER — PREDNISONE 10 MG PO TABS: ORAL_TABLET | ORAL | 0 refills | Status: DC

## 2024-03-13 MED ORDER — CETIRIZINE HCL 10 MG PO TABS: 10.0000 mg | ORAL_TABLET | Freq: Every day | ORAL | 1 refills | Status: AC

## 2024-03-20 ENCOUNTER — Ambulatory Visit: Admitting: Plastic Surgery

## 2024-03-20 ENCOUNTER — Encounter: Payer: Self-pay | Admitting: Plastic Surgery

## 2024-03-20 VITALS — BP 110/78 | HR 75 | Ht 68.0 in | Wt 230.2 lb

## 2024-03-20 DIAGNOSIS — N62 Hypertrophy of breast: Secondary | ICD-10-CM | POA: Diagnosis not present

## 2024-03-20 DIAGNOSIS — L732 Hidradenitis suppurativa: Secondary | ICD-10-CM

## 2024-03-20 DIAGNOSIS — N6481 Ptosis of breast: Secondary | ICD-10-CM | POA: Diagnosis not present

## 2024-03-20 NOTE — Progress Notes (Signed)
 Referring Provider Lorre Munroe, NP [address] [place],  [place] [address]   CC:  Chief Complaint  Patient presents with   Advice Only      Kaitlyn Kennedy is an 43 y.o. female.  HPI: [name] is a 43 year old female who presents today for evaluation for a breast reduction.  Patient has been seen in the past in 2021 for similar concerns.  She has upper back and neck pain which she attributes to the large size of her breast but more significantly patient has ongoing issues with hidradenitis suppurativa and is curious to know whether a breast reduction will improve her infections.  Since her last evaluation she has lost approximately 50 pounds.  She notes significant ptosis of her breasts which make it difficult to find bras that hold them appropriately.  No Known Allergies  Outpatient Encounter Medications as of 03/20/2024  Medication Sig   cetirizine (ZYRTEC) 10 MG tablet Take 1 tablet (10 mg total) by mouth daily.   HYRIMOZ 80 MG/0.8ML SOAJ 80 mg.   ibuprofen (ADVIL) 800 MG tablet Take 1 tablet (800 mg total) by mouth every 8 (eight) hours as needed.   metFORMIN (GLUCOPHAGE) 500 MG tablet Take 1 tablet (500 mg total) by mouth daily with breakfast.   metroNIDAZOLE (METROGEL) 0.75 % gel Apply 1 Application topically at bedtime.   Norethindrone Acetate-Ethinyl Estradiol (JUNEL 1.5/30) 1.5-30 MG-MCG tablet Take 1 tablet by mouth daily.   Phentermine-Topiramate 3.75-23 MG CP24 Take 1 capsule by mouth daily.   predniSONE (DELTASONE) 10 MG tablet Take 6 tabs on day 1, 5 tabs on day 2, 4 tabs on day 3, 3 tabs on day 4, 2 tabs on day 5, 1 tab on day 6   spironolactone (ALDACTONE) 100 MG tablet Take 1 tablet (100 mg total) by mouth daily.   No facility-administered encounter medications on file as of 03/20/2024.     Past Medical History:  Diagnosis Date   No pertinent past medical history     Past Surgical History:  Procedure Laterality Date   HERNIA REPAIR  1987   INGUINAL HERNIA  REPAIR  1985   VAGINAL DELIVERY  11/30/2011   Procedure: VAGINAL DELIVERY;  Surgeon: Brock Bad, MD;  Location: [facility];  Service: Gynecology;;    Family History  Problem Relation Age of Onset   Hypertension Mother    Hyperlipidemia Mother    Hypertension Father    Diabetes Father    Hyperlipidemia Father    Heart disease Brother    Sarcoidosis Brother    Breast cancer Maternal Grandmother    Prostate cancer Maternal Grandfather    Cervical cancer Paternal Grandmother    Asthma Paternal Grandfather    Emphysema Paternal Grandfather     Social History   Social History Narrative   Not on file     Review of Systems General: Denies fevers, chills, weight loss CV: Denies chest pain, shortness of breath, palpitations Breast: Pendulous breasts which are difficult to contain within her bra.  Ongoing issues with hidradenitis supra TIVA.  This is improved with use of Biologics and intermittent use of prednisone.  No active infections today.  Recently underwent a breast biopsy for an abnormality noted on mammogram  Physical Exam    03/20/2024    9:13 AM 02/22/2024    1:58 PM 02/06/2024    8:41 AM  Vitals with BMI  Height 5\' 8"  5' 7.5" 5' 7.5"  Weight 230 lbs 3 oz 248 lbs 3  oz 246 lbs  BMI 35.01 38.28 37.94  Systolic 110 128 308  Diastolic 78 84 82  Pulse 75      General:  No acute distress,  Alert and oriented, Non-Toxic, Normal speech and affect Breast: Patient has large pendulous ptotic breast.  No specific dominant masses are appreciated today but she does have widespread scarring from her hidradenitis Mammogram: February 2025 BI-RADS 0 followed by a BI-RADS 4.  Biopsy did not reveal any malignancy but did show lymphoid tissue.  A 74-month interval follow-up is recommended and has been scheduled Assessment/Plan Macromastia: Patient has large breasts but they are mostly ptotic.  To achieve her goals I do not believe that I would be able to meet the 978 g requirement for  breast reduction.  She is interested also in the possibility of a mastopexy.  I am not sure that this is a reasonable procedure as the patient will need to be off of her Biologics which she says will automatically cause a flare of her hidradenitis.  This puts her at a very high risk for infection afterwards.  Staying on her Biologics will significantly decrease the possibility of normal wound healing.  Additionally she has not completed her workup for the finding on her mammogram.  At this point she will complete the follow-up mammogram in September while she considers what she would like to do.  She will follow-up with me if she would like to discuss the possibility of surgery further.  30 minutes spent reviewing the patient's chart, examining the patient, discussing options, and documenting.  Santiago Glad 03/20/2024, 10:41 AM

## 2024-03-31 ENCOUNTER — Other Ambulatory Visit: Payer: Self-pay | Admitting: Internal Medicine

## 2024-03-31 NOTE — Telephone Encounter (Signed)
 Requested medications are due for refill today.  yes  Requested medications are on the active medications list.  yes  Last refill. 02/29/2024 #30 0 rf  Future visit scheduled.   yes  Notes to clinic.  Refill not delegated.    Requested Prescriptions  Pending Prescriptions Disp Refills   QSYMIA  3.75-23 MG CP24 [Pharmacy Med Name: QSYMIA  3.75 MG-23 MG CAPSULE] 30 capsule 0    Sig: TAKE 1 CAPSULE BY MOUTH EVERY DAY     Not Delegated - Neurology: Anticonvulsants - Controlled - phentermine  / topiramate  Failed - 03/31/2024  5:27 PM      Failed - This refill cannot be delegated      Failed - Glucose (serum) in normal range and within 360 days    Glucose, Bld  Date Value Ref Range Status  02/22/2024 64 (L) 65 - 139 mg/dL Final    Comment:    .        Non-fasting reference interval .          Failed - Valid encounter within last 6 months    Recent Outpatient Visits           1 month ago Encounter for general adult medical examination with abnormal findings   [facility] [place], Rankin Buzzard, NP   1 month ago Mass of upper outer quadrant of right breast   [place] [facility] [place], Rankin Buzzard, NP              Passed - Cr in normal range and within 360 days    Creat  Date Value Ref Range Status  02/22/2024 0.78 0.50 - 0.99 mg/dL Final         Passed - CO2 in normal range and within 360 days    CO2  Date Value Ref Range Status  02/22/2024 26 20 - 32 mmol/L Final         Passed - ALT in normal range and within 360 days    ALT  Date Value Ref Range Status  02/22/2024 14 6 - 29 U/L Final         Passed - AST in normal range and within 360 days    AST  Date Value Ref Range Status  02/22/2024 16 10 - 30 U/L Final         Passed - K in normal range and within 360 days    Potassium  Date Value Ref Range Status  02/22/2024 3.9 3.5 - 5.3 mmol/L Final         Passed - Completed PHQ-2 or PHQ-9 in the last 360 days       Passed - Patient is not pregnant      Passed - Last BP in normal range    BP Readings from Last 1 Encounters:  03/20/24 110/78         Passed - Last Heart Rate in normal range    Pulse Readings from Last 1 Encounters:  03/20/24 75

## 2024-04-15 ENCOUNTER — Telehealth: Admitting: Internal Medicine

## 2024-04-15 ENCOUNTER — Encounter: Payer: Self-pay | Admitting: Internal Medicine

## 2024-04-15 DIAGNOSIS — L732 Hidradenitis suppurativa: Secondary | ICD-10-CM

## 2024-04-15 MED ORDER — PREDNISONE 10 MG PO TABS: ORAL_TABLET | ORAL | 0 refills | Status: DC

## 2024-04-15 NOTE — Progress Notes (Signed)
 Virtual Visit via Video Note  I connected with Kaitlyn Kennedy on 04/15/24 at  1:40 PM EDT by a video enabled telemedicine application and verified that I am speaking with the correct person using two identifiers.  Location: Patient: Home Provider: Office  Person's participating in this video call: Helayne Lo, NP-C and Sandar Rodenbeck   I discussed the limitations of evaluation and management by telemedicine and the availability of in person appointments. The patient expressed understanding and agreed to proceed.  History of Present Illness:  Patient wanting to follow-up on her hidradenitis suppurativa.  She has been dealing with this chronic condition for many years with multiple boils and abscesses in between and under the breast and under the pannus.  She reports these areas typically chronically drain purulent fluid and sometimes flare.  She reports she does not currently have a flare but some of the areas are draining.  She does note that these seem to drain more around the time of ovulation and menses.  She has been seeing dermatology for the same and is currently on Humira 80 mg weekly.  They did recommend that she start OCPs as this seems to have a hormonal connection and she is considering starting this after her next period.  She does note that when she takes prednisone  for other conditions, that the hidradenitis seems to "dry up".  She would like to know if she could have a prescription for prednisone  to have on hand for when the area start to drain badly.  She has seen a general surgeon in the past to discuss breast reduction and removal of the tunneled areas of her hidradenitis suppurativa although the surgeon did not feel like either of these would be helpful in her situation.  Past Medical History:  Diagnosis Date   No pertinent past medical history     Current Outpatient Medications  Medication Sig Dispense Refill   cetirizine  (ZYRTEC ) 10 MG tablet Take 1 tablet (10 mg total)  by mouth daily. 90 tablet 1   HYRIMOZ 80 MG/0.8ML SOAJ 80 mg.     ibuprofen  (ADVIL ) 800 MG tablet Take 1 tablet (800 mg total) by mouth every 8 (eight) hours as needed. 90 tablet 0   metFORMIN  (GLUCOPHAGE ) 500 MG tablet Take 1 tablet (500 mg total) by mouth daily with breakfast. 90 tablet 1   metroNIDAZOLE  (METROGEL ) 0.75 % gel Apply 1 Application topically at bedtime.     Norethindrone  Acetate-Ethinyl Estradiol (JUNEL  1.5/30) 1.5-30 MG-MCG tablet Take 1 tablet by mouth daily. 84 tablet 1   predniSONE  (DELTASONE ) 10 MG tablet Take 6 tabs on day 1, 5 tabs on day 2, 4 tabs on day 3, 3 tabs on day 4, 2 tabs on day 5, 1 tab on day 6 21 tablet 0   QSYMIA  3.75-23 MG CP24 TAKE 1 CAPSULE BY MOUTH EVERY DAY 30 capsule 0   spironolactone  (ALDACTONE ) 100 MG tablet Take 1 tablet (100 mg total) by mouth daily. 90 tablet 1   No current facility-administered medications for this visit.    No Known Allergies  Family History  Problem Relation Age of Onset   Hypertension Mother    Hyperlipidemia Mother    Hypertension Father    Diabetes Father    Hyperlipidemia Father    Heart disease Brother    Sarcoidosis Brother    Breast cancer Maternal Grandmother    Prostate cancer Maternal Grandfather    Cervical cancer Paternal Grandmother    Asthma Paternal Grandfather    Emphysema  Paternal Grandfather     Social History   Socioeconomic History   Marital status: Married    Spouse name: Not on file   Number of children: Not on file   Years of education: Not on file   Highest education level: Not on file  Occupational History   Not on file  Tobacco Use   Smoking status: Former   Smokeless tobacco: Never  Vaping Use   Vaping status: Some Days  Substance and Sexual Activity   Alcohol use: No   Drug use: No   Sexual activity: Yes    Partners: Male    Birth control/protection: Pill  Other Topics Concern   Not on file  Social History Narrative   Not on file   Social Drivers of Health    Financial Resource Strain: Not on file  Food Insecurity: Not on file  Transportation Needs: Not on file  Physical Activity: Not on file  Stress: Not on file  Social Connections: Not on file  Intimate Partner Violence: Not on file     Constitutional: Denies fever, malaise, fatigue, headache or abrupt weight changes.  Respiratory: Denies difficulty breathing, shortness of breath, cough or sputum production.   Cardiovascular: Denies chest pain, chest tightness, palpitations or swelling in the hands or feet.  Musculoskeletal: Denies decrease in range of motion, difficulty with gait, muscle pain or joint pain and swelling.  Skin: Patient reports recurrent boils and abscesses in between and under her breast and underneath her pannus.  Denies rashes or ulcercations.  Neurological: Denies dizziness, difficulty with memory, difficulty with speech or problems with balance and coordination.    No other specific complaints in a complete review of systems (except as listed in HPI above).  Observations/Objective:   Wt Readings from Last 3 Encounters:  03/20/24 230 lb 3.2 oz (104.4 kg)  02/22/24 248 lb 3.2 oz (112.6 kg)  02/06/24 246 lb (111.6 kg)    General: Appears her stated age, obese, in NAD. Skin: Multiple areas in between and underneath the breast currently draining.  Scarring noted underneath bilateral breast. Pulmonary/Chest: Normal effort. No respiratory distress.  Neurological: Alert and oriented.   BMET    Component Value Date/Time   NA 139 02/22/2024 1418   K 3.9 02/22/2024 1418   CL 105 02/22/2024 1418   CO2 26 02/22/2024 1418   GLUCOSE 64 (L) 02/22/2024 1418   BUN 9 02/22/2024 1418   CREATININE 0.78 02/22/2024 1418   CALCIUM  9.1 02/22/2024 1418    Lipid Panel     Component Value Date/Time   CHOL 197 02/22/2024 1418   TRIG 144 02/22/2024 1418   HDL 48 (L) 02/22/2024 1418   CHOLHDL 4.1 02/22/2024 1418   VLDL 20.6 02/14/2021 1255   LDLCALC 123 (H) 02/22/2024  1418    CBC    Component Value Date/Time   WBC 11.5 (H) 02/22/2024 1418   RBC 4.19 02/22/2024 1418   HGB 11.9 02/22/2024 1418   HCT 36.9 02/22/2024 1418   PLT 449 (H) 02/22/2024 1418   MCV 88.1 02/22/2024 1418   MCH 28.4 02/22/2024 1418   MCHC 32.2 02/22/2024 1418   RDW 13.1 02/22/2024 1418   LYMPHSABS 2.5 03/14/2017 0929   MONOABS 0.7 03/14/2017 0929   EOSABS 0.3 03/14/2017 0929   BASOSABS 0.1 03/14/2017 0929    Hgb A1C Lab Results  Component Value Date   HGBA1C 5.6 02/22/2024       Assessment and Plan: Hidradenitis suppurativa:  Continue Humira 80 mg weekly  as prescribed dermatology Rx for prednisone  taper to take only as needed but discussed avoiding overuse as this can lead to weight gain, weakening of the bones and diabetes Try to keep the areas clean and dry Advised her to start the OCPs on the Sunday after her next period  RTC in 4 months for follow-up of chronic conditions  Follow Up Instructions:    I discussed the assessment and treatment plan with the patient. The patient was provided an opportunity to ask questions and all were answered. The patient agreed with the plan and demonstrated an understanding of the instructions.   The patient was advised to call back or seek an in-person evaluation if the symptoms worsen or if the condition fails to improve as anticipated.   Helayne Lo, NP

## 2024-04-15 NOTE — Patient Instructions (Signed)
 Hidradenitis Suppurativa    Hidradenitis suppurativa is a long-term (chronic) skin disease. It is similar to a severe form of acne, but it affects areas of the body where acne would be unusual, especially areas of the body where skin rubs against skin and becomes moist. These include:  Underarms.  Groin.  Genital area.  Buttocks.  Upper thighs.  Breasts.  Hidradenitis suppurativa may start out as small lumps or pimples caused by blocked skin pores, sweat glands, or hair follicles. Pimples may develop into deep sores that break open (rupture) and drain pus. Over time, affected areas of skin may thicken and become scarred. This condition is rare and does not spread from person to person (non-contagious).  What are the causes?  The exact cause of this condition is not known. It may be related to:  Female and female hormones.  An overactive disease-fighting system (immune system). The immune system may over-react to blocked hair follicles or sweat glands and cause swelling and pus-filled sores.  What increases the risk?  You are more likely to develop this condition if you:  Are female.  Are 23-75 years old.  Have a family history of hidradenitis suppurativa.  Have a personal history of acne.  Are overweight.  Smoke.  Take the medicine lithium.  What are the signs or symptoms?  The first symptoms are usually painful bumps in the skin, similar to pimples. The condition may get worse over time (progress), or it may only cause mild symptoms. If the disease progresses, symptoms may include:  Skin bumps getting bigger and growing deeper into the skin.  Bumps rupturing and draining pus.  Itchy, infected skin.  Skin getting thicker and scarred.  Tunnels under the skin (fistulas) where pus drains from a bump.  Pain during daily activities, such as pain during walking if your groin area is affected.  Emotional problems, such as stress or depression. This condition may affect your appearance and your ability or willingness to wear  certain clothes or do certain activities.  How is this diagnosed?  This condition is diagnosed by a health care provider who specializes in skin conditions (dermatologist). You may be diagnosed based on:  Your symptoms and medical history.  A physical exam.  Testing a pus sample for infection.  Blood tests.  How is this treated?  Your treatment will depend on how severe your symptoms are. The same treatment will not work for everybody with this condition. You may need to try several treatments to find what works best for you. Treatment may include:  Cleaning and bandaging (dressing) your wounds as needed.  Lifestyle changes, such as new skin care routines.  Taking medicines, such as:  Antibiotics.  Acne medicines.  Medicines to reduce the activity of the immune system.  A diabetes medicine (metformin).  Birth control pills, for women.  Steroids to reduce swelling and pain.  Working with a mental health care provider, if you experience emotional distress due to this condition.  If you have severe symptoms that do not get better with medicine, you may need surgery. Surgery may involve:  Using a laser to clear the skin and remove hair follicles.  Opening and draining deep sores.  Removing the areas of skin that are diseased and scarred.  Follow these instructions at home:  Medicines    Take over-the-counter and prescription medicines only as told by your health care provider.  If you were prescribed antibiotics, take them as told by your health care provider. Do not  stop using the antibiotic even if your condition improves.  Skin care  If you have open wounds, cover them with a clean dressing as told by your health care provider. Keep wounds clean by washing them gently with soap and water when you bathe.  Do not shave the areas where you get hidradenitis suppurativa.  Wear loose-fitting clothes.  Try to avoid getting overheated or sweaty. If you get sweaty or wet, change into clean, dry clothes as soon as you can.  To  help relieve pain and itchiness, cover sore areas with a warm, clean washcloth (warm compress) for 5-10 minutes as often as needed.  Your healthcare provider may recommend an antiperspirant deodorant that may be gentle on your skin.  A daily antiseptic wash to cleanse affected areas may be suggested by your healthcare provider.  General instructions  Learn as much as you can about your disease so that you have an active role in your treatment. Work closely with your health care provider to find treatments that work for you.  If you are overweight, work with your health care provider to lose weight as recommended.  Do not use any products that contain nicotine or tobacco. These products include cigarettes, chewing tobacco, and vaping devices, such as e-cigarettes. If you need help quitting, ask your health care provider.  If you struggle with living with this condition, talk with your health care provider or work with a mental health care provider as recommended.  Keep all follow-up visits.  Where to find more information  Hidradenitis Suppurativa Foundation, Inc.: www.hs-foundation.org  American Academy of Dermatology: InfoExam.si  Contact a health care provider if:  You have a flare-up of hidradenitis suppurativa.  You have a fever or chills.  You have trouble controlling your symptoms at home.  You have trouble doing your daily activities because of your symptoms.  You have trouble dealing with emotional problems related to your condition.  Summary  Hidradenitis suppurativa is a long-term (chronic) skin disease. It is similar to a severe form of acne, but it affects areas of the body where acne would be unusual.  The first symptoms are usually painful bumps in the skin, similar to pimples. The condition may only cause mild symptoms, or it may get worse over time (progress).  If you have open wounds, cover them with a clean dressing as told by your health care provider. Keep wounds clean by washing them gently with  soap and water when you bathe.  Besides skin care, treatment may include medicines, laser treatment, and surgery.  This information is not intended to replace advice given to you by your health care provider. Make sure you discuss any questions you have with your health care provider.  Document Revised: 01/18/2022 Document Reviewed: 01/18/2022  Elsevier Patient Education  2024 [employer].

## 2024-04-28 ENCOUNTER — Other Ambulatory Visit: Payer: Self-pay | Admitting: Internal Medicine

## 2024-04-28 MED ORDER — METFORMIN HCL 500 MG PO TABS: 500.0000 mg | ORAL_TABLET | Freq: Two times a day (BID) | ORAL | 1 refills | Status: DC

## 2024-05-06 ENCOUNTER — Other Ambulatory Visit: Payer: Self-pay | Admitting: Internal Medicine

## 2024-05-08 NOTE — Telephone Encounter (Signed)
 Requested medications are due for refill today.  yes  Requested medications are on the active medications list.  yes  Last refill. 04/01/2024 #30 0 rf  Future visit scheduled.   yes  Notes to clinic.  Refill not delegated.    Requested Prescriptions  Pending Prescriptions Disp Refills   QSYMIA  3.75-23 MG CP24 [Pharmacy Med Name: QSYMIA  3.75 MG-23 MG CAPSULE] 30 capsule 0    Sig: TAKE 1 CAPSULE BY MOUTH EVERY DAY     Not Delegated - Neurology: Anticonvulsants - Controlled - phentermine  / topiramate  Failed - 05/08/2024  2:20 PM      Failed - This refill cannot be delegated      Failed - Glucose (serum) in normal range and within 360 days    Glucose, Bld  Date Value Ref Range Status  02/22/2024 64 (L) 65 - 139 mg/dL Final    Comment:    .        Non-fasting reference interval .          Failed - Valid encounter within last 6 months    Recent Outpatient Visits           3 weeks ago Hydradenitis   [place] [facility] [place], [place] W, NP   2 months ago Encounter for general adult medical examination with abnormal findings   [place] [facility] [place], Rankin Buzzard, NP   3 months ago Mass of upper outer quadrant of right breast   [place] [facility] [place], Rankin Buzzard, NP              Passed - Cr in normal range and within 360 days    Creat  Date Value Ref Range Status  02/22/2024 0.78 0.50 - 0.99 mg/dL Final         Passed - CO2 in normal range and within 360 days    CO2  Date Value Ref Range Status  02/22/2024 26 20 - 32 mmol/L Final         Passed - ALT in normal range and within 360 days    ALT  Date Value Ref Range Status  02/22/2024 14 6 - 29 U/L Final         Passed - AST in normal range and within 360 days    AST  Date Value Ref Range Status  02/22/2024 16 10 - 30 U/L Final         Passed - K in normal range and within 360 days    Potassium  Date Value Ref Range Status  02/22/2024 3.9 3.5 -  5.3 mmol/L Final         Passed - Completed PHQ-2 or PHQ-9 in the last 360 days      Passed - Patient is not pregnant      Passed - Last BP in normal range    BP Readings from Last 1 Encounters:  03/20/24 110/78         Passed - Last Heart Rate in normal range    Pulse Readings from Last 1 Encounters:  03/20/24 75

## 2024-05-12 ENCOUNTER — Other Ambulatory Visit: Payer: Self-pay | Admitting: Internal Medicine

## 2024-05-12 ENCOUNTER — Telehealth: Payer: Self-pay

## 2024-05-12 NOTE — Telephone Encounter (Signed)
 Patient checking on the status of her Qysmia prior authorization. Thanks!

## 2024-05-15 ENCOUNTER — Telehealth: Payer: Self-pay

## 2024-05-15 ENCOUNTER — Other Ambulatory Visit (HOSPITAL_COMMUNITY): Payer: Self-pay

## 2024-05-15 NOTE — Telephone Encounter (Signed)
 Pharmacy Patient Advocate Encounter   Received notification from Pt Calls Messages that prior authorization for Phentermine -Topiramate  ER 3.75-23MG  er capsules is required/requested.   Insurance verification completed.   The patient is insured through [facility] .   Per test claim: PA required and submitted KEY/EOC/Request #: [id number] from 05/15/2024 to 08/13/2024

## 2024-05-15 NOTE — Telephone Encounter (Signed)
 PA request has been Approved. New Encounter has been or will be created for follow up. For additional info see Pharmacy Prior Auth telephone encounter from 05/15/2024.

## 2024-07-18 ENCOUNTER — Other Ambulatory Visit: Payer: Self-pay | Admitting: Internal Medicine

## 2024-07-18 MED ORDER — METFORMIN HCL 1000 MG PO TABS: 1000.0000 mg | ORAL_TABLET | Freq: Two times a day (BID) | ORAL | 1 refills | Status: DC

## 2024-08-10 ENCOUNTER — Other Ambulatory Visit: Payer: Self-pay | Admitting: Internal Medicine

## 2024-08-12 NOTE — Telephone Encounter (Signed)
 Requested medication (s) are due for refill today: yes  Requested medication (s) are on the active medication list: yes  Last refill:  05/08/24 #30  Future visit scheduled: yes  Notes to clinic:  med not delegated to NT to RF   Requested Prescriptions  Pending Prescriptions Disp Refills   QSYMIA  3.75-23 MG CP24 [Pharmacy Med Name: QSYMIA  3.75 MG-23 MG CAPSULE] 30 capsule 0    Sig: TAKE 1 CAPSULE BY MOUTH EVERY DAY     Not Delegated - Neurology: Anticonvulsants - Controlled - phentermine  / topiramate  Failed - 08/12/2024 12:36 PM      Failed - This refill cannot be delegated      Failed - Glucose (serum) in normal range and within 360 days    Glucose, Bld  Date Value Ref Range Status  02/22/2024 64 (L) 65 - 139 mg/dL Final    Comment:    .        Non-fasting reference interval .          Passed - Cr in normal range and within 360 days    Creat  Date Value Ref Range Status  02/22/2024 0.78 0.50 - 0.99 mg/dL Final         Passed - CO2 in normal range and within 360 days    CO2  Date Value Ref Range Status  02/22/2024 26 20 - 32 mmol/L Final         Passed - ALT in normal range and within 360 days    ALT  Date Value Ref Range Status  02/22/2024 14 6 - 29 U/L Final         Passed - AST in normal range and within 360 days    AST  Date Value Ref Range Status  02/22/2024 16 10 - 30 U/L Final         Passed - K in normal range and within 360 days    Potassium  Date Value Ref Range Status  02/22/2024 3.9 3.5 - 5.3 mmol/L Final         Passed - Completed PHQ-2 or PHQ-9 in the last 360 days      Passed - Patient is not pregnant      Passed - Last BP in normal range    BP Readings from Last 1 Encounters:  03/20/24 110/78         Passed - Last Heart Rate in normal range    Pulse Readings from Last 1 Encounters:  03/20/24 75         Passed - Valid encounter within last 6 months    Recent Outpatient Visits           3 months ago Hydradenitis   [place]  [facility] [place], Angeline ORN, NP   5 months ago Encounter for general adult medical examination with abnormal findings   [place] [facility] [place], Angeline ORN, NP   6 months ago Mass of upper outer quadrant of right breast   [place] [facility] [place], Angeline ORN, NP

## 2024-08-25 ENCOUNTER — Telehealth: Payer: Self-pay

## 2024-08-25 ENCOUNTER — Ambulatory Visit (INDEPENDENT_AMBULATORY_CARE_PROVIDER_SITE_OTHER): Admitting: Internal Medicine

## 2024-08-25 ENCOUNTER — Encounter: Payer: Self-pay | Admitting: Internal Medicine

## 2024-08-25 VITALS — BP 118/74 | Ht 68.0 in | Wt 227.2 lb

## 2024-08-25 DIAGNOSIS — E6609 Other obesity due to excess calories: Secondary | ICD-10-CM

## 2024-08-25 DIAGNOSIS — R7303 Prediabetes: Secondary | ICD-10-CM

## 2024-08-25 DIAGNOSIS — N760 Acute vaginitis: Secondary | ICD-10-CM

## 2024-08-25 DIAGNOSIS — E66811 Obesity, class 1: Secondary | ICD-10-CM | POA: Diagnosis not present

## 2024-08-25 DIAGNOSIS — L732 Hidradenitis suppurativa: Secondary | ICD-10-CM

## 2024-08-25 DIAGNOSIS — E7800 Pure hypercholesterolemia, unspecified: Secondary | ICD-10-CM

## 2024-08-25 DIAGNOSIS — D75839 Thrombocytosis, unspecified: Secondary | ICD-10-CM | POA: Insufficient documentation

## 2024-08-25 DIAGNOSIS — Z6834 Body mass index (BMI) 34.0-34.9, adult: Secondary | ICD-10-CM

## 2024-08-25 DIAGNOSIS — E785 Hyperlipidemia, unspecified: Secondary | ICD-10-CM | POA: Insufficient documentation

## 2024-08-25 MED ORDER — PREDNISONE 10 MG PO TABS: ORAL_TABLET | ORAL | 0 refills | Status: DC

## 2024-08-25 MED ORDER — METFORMIN HCL 1000 MG PO TABS: 1000.0000 mg | ORAL_TABLET | Freq: Every day | ORAL | Status: AC

## 2024-08-25 NOTE — Assessment & Plan Note (Signed)
 Encouraged diet and exercise for weight loss Continue qsymia  3.75-25 mg daily as needed

## 2024-08-25 NOTE — Assessment & Plan Note (Signed)
 Continue metformin  1000 mg daily Encourage low-carb diet

## 2024-08-25 NOTE — Telephone Encounter (Signed)
 Patient was in office this morning and states her qysmia needs a PA. Can we check on this for patient? Thanks!

## 2024-08-25 NOTE — Assessment & Plan Note (Signed)
 Currently not an issue We will monitor

## 2024-08-25 NOTE — Assessment & Plan Note (Signed)
 Continue hyrimoz 80 mg injection, spironolactone  100 mg daily per dermatology Will monitor

## 2024-08-25 NOTE — Progress Notes (Signed)
 Subjective:    Patient ID: Kaitlyn Kennedy, female    DOB: Aug 06, 1981, 43 y.o.   MRN: [id number]  HPI  Patient presents to clinic today for follow-up of chronic conditions.  Prediabetes: Her last A1c was 5.6%, 02/2024.  She is taking metformin  as prescribed.  She does not check her sugars.  Hidradenitis suppurativa: Managed with hyrimoz and spironolactone . She uses clindamycin  gel daily and .  She follows with dermatology.  Recurrent vaginitis: Currently not an issues. She no longer uses ketoconazole  or metrogel .  HLD: His last LDL 123, triglycerides 144, 02/2024. She is not taking any cholesterol lowering medication at this time. She consumes a low fat diet.   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 She does not follow with hematology.  Review of Systems     Past Medical History:  Diagnosis Date   No pertinent past medical history     Current Outpatient Medications  Medication Sig Dispense Refill   cetirizine  (ZYRTEC ) 10 MG tablet Take 1 tablet (10 mg total) by mouth daily. 90 tablet 1   clindamycin  (CLEOCIN  T) 1 % lotion SMARTSIG:sparingly Topical Every Night     HYRIMOZ 80 MG/0.8ML SOAJ 80 mg.     ibuprofen  (ADVIL ) 800 MG tablet Take 1 tablet (800 mg total) by mouth every 8 (eight) hours as needed. 90 tablet 0   metFORMIN  (GLUCOPHAGE ) 1000 MG tablet Take 1 tablet (1,000 mg total) by mouth 2 (two) times daily with a meal. 180 tablet 1   metroNIDAZOLE  (METROGEL ) 0.75 % gel Apply 1 Application topically at bedtime.     Norethindrone  Acetate-Ethinyl Estradiol (JUNEL  1.5/30) 1.5-30 MG-MCG tablet Take 1 tablet by mouth daily. 84 tablet 1   QSYMIA  3.75-23 MG CP24 TAKE 1 CAPSULE BY MOUTH EVERY DAY 30 capsule 0   spironolactone  (ALDACTONE ) 100 MG tablet Take 1 tablet (100 mg total) by mouth daily. 90 tablet 1   predniSONE  (DELTASONE ) 10 MG tablet Take 7 tabs on day 1, 6 tabs on day 2, 5 tabs on day 3, 4 tabs on day 4, 3 tabs on day 5, 2 tab on day 6, 1 tab on day 7  (Patient not taking: Reported on 08/25/2024) 28 tablet 0   No current facility-administered medications for this visit.    No Known Allergies  Family History  Problem Relation Age of Onset   Hypertension Mother    Hyperlipidemia Mother    Hypertension Father    Diabetes Father    Hyperlipidemia Father    Heart disease Brother    Sarcoidosis Brother    Breast cancer Maternal Grandmother    Prostate cancer Maternal Grandfather    Cervical cancer Paternal Grandmother    Asthma Paternal Grandfather    Emphysema Paternal Grandfather     Social History   Socioeconomic History   Marital status: Married    Spouse name: Not on file   Number of children: Not on file   Years of education: Not on file   Highest education level: Not on file  Occupational History   Not on file  Tobacco Use   Smoking status: Former   Smokeless tobacco: Never  Vaping Use   Vaping status: Some Days  Substance and Sexual Activity   Alcohol use: No   Drug use: No   Sexual activity: Yes    Partners: Male    Birth control/protection: Pill  Other Topics Concern   Not on file  Social History Narrative   Not on file   Social  Drivers of Corporate investment banker Strain: Not on file  Food Insecurity: Not on file  Transportation Needs: Not on file  Physical Activity: Not on file  Stress: Not on file  Social Connections: Not on file  Intimate Partner Violence: Not on file     Constitutional: Denies fever, malaise, fatigue, headache or abrupt weight changes.  HEENT: Denies eye pain, eye redness, ear pain, ringing in the ears, wax buildup, runny nose, nasal congestion, bloody nose, or sore throat. Respiratory: Denies difficulty breathing, shortness of breath, cough or sputum production.   Cardiovascular: Denies chest pain, chest tightness, palpitations or swelling in the hands or feet.  Gastrointestinal: Denies abdominal pain, bloating, constipation, diarrhea or blood in the stool.  GU: Denies  urgency, frequency, pain with urination, burning sensation, blood in urine, odor or discharge. Musculoskeletal: Denies decrease in range of motion, difficulty with gait, muscle pain or joint pain and swelling.  Skin: Patient reports recurrent boils.  Denies redness, rashes, or ulcercations.  Neurological: Denies dizziness, difficulty with memory, difficulty with speech or problems with balance and coordination.  Psych: Denies anxiety, depression, SI/HI.  No other specific complaints in a complete review of systems (except as listed in HPI above).  Objective:   Physical Exam BP 118/74 (BP Location: Left Arm, Patient Position: Sitting, Cuff Size: Large)   Ht 5' 8 (1.727 m)   Wt 227 lb 3.2 oz (103.1 kg)   LMP 08/19/2024 (Exact Date)   BMI 34.55 kg/m   Wt Readings from Last 3 Encounters:  03/20/24 230 lb 3.2 oz (104.4 kg)  02/22/24 248 lb 3.2 oz (112.6 kg)  02/06/24 246 lb (111.6 kg)    General: Appears her stated age, obese in NAD. Skin: Warm, dry and intact. Scarring noted under breast. Cardiovascular: Normal rate and rhythm. S1,S2 noted.  No murmur, rubs or gallops noted.  Pulmonary/Chest: Normal effort and positive vesicular breath sounds. No respiratory distress. No wheezes, rales or ronchi noted.  Musculoskeletal: No difficulty with gait.  Neurological: Alert and oriented.  Psychiatric: Mood and affect normal. Behavior is normal. Judgment and thought content normal.     BMET    Component Value Date/Time   NA 139 02/22/2024 1418   K 3.9 02/22/2024 1418   CL 105 02/22/2024 1418   CO2 26 02/22/2024 1418   GLUCOSE 64 (L) 02/22/2024 1418   BUN 9 02/22/2024 1418   CREATININE 0.78 02/22/2024 1418   CALCIUM  9.1 02/22/2024 1418    Lipid Panel     Component Value Date/Time   CHOL 197 02/22/2024 1418   TRIG 144 02/22/2024 1418   HDL 48 (L) 02/22/2024 1418   CHOLHDL 4.1 02/22/2024 1418   VLDL 20.6 02/14/2021 1255   LDLCALC 123 (H) 02/22/2024 1418    CBC    Component  Value Date/Time   WBC 11.5 (H) 02/22/2024 1418   RBC 4.19 02/22/2024 1418   HGB 11.9 02/22/2024 1418   HCT 36.9 02/22/2024 1418   PLT 449 (H) 02/22/2024 1418   MCV 88.1 02/22/2024 1418   MCH 28.4 02/22/2024 1418   MCHC 32.2 02/22/2024 1418   RDW 13.1 02/22/2024 1418   LYMPHSABS 2.5 03/14/2017 0929   MONOABS 0.7 03/14/2017 0929   EOSABS 0.3 03/14/2017 0929   BASOSABS 0.1 03/14/2017 0929    Hgb A1C Lab Results  Component Value Date   HGBA1C 5.6 02/22/2024            Assessment & Plan:     RTC in 6  months for your annual exam Angeline Laura, NP

## 2024-08-25 NOTE — Patient Instructions (Signed)

## 2024-08-25 NOTE — Assessment & Plan Note (Signed)
 Will check CBC at annual

## 2024-08-25 NOTE — Assessment & Plan Note (Signed)
 Encouraged low-fat diet Will check lipid panel annual exam

## 2024-08-26 ENCOUNTER — Other Ambulatory Visit (HOSPITAL_COMMUNITY): Payer: Self-pay

## 2024-08-26 NOTE — Telephone Encounter (Signed)
 PA renewal has been approved, thank you

## 2024-08-26 NOTE — Telephone Encounter (Signed)
 Renewal PA:  Pharmacy Patient Advocate Encounter   Received notification from Physician's Office that prior authorization for Qsymia  3.75-23MG  er capsules is required/requested.   Insurance verification completed.   The patient is insured through [facility] .   Per test claim: PA required and submitted KEY/EOC/Request #: [id number] from 08/26/2024 to 11/24/2024

## 2024-09-02 ENCOUNTER — Telehealth: Payer: Self-pay | Admitting: Pharmacy Technician

## 2024-09-02 ENCOUNTER — Other Ambulatory Visit (HOSPITAL_COMMUNITY): Payer: Self-pay

## 2024-09-02 NOTE — Telephone Encounter (Signed)
 Pharmacy Patient Advocate Encounter   Received notification from Onbase that prior authorization for Phentermine -topiramate  er 3.75-23mg  capsules is required/requested.   Insurance verification completed.   The patient is insured through [facility] .   Per test claim: Refill too soon. PA is not needed at this time. Medication was filled 08/31/2024. Next eligible fill date is 09/24/2024.

## 2024-09-27 ENCOUNTER — Other Ambulatory Visit: Payer: Self-pay | Admitting: Internal Medicine

## 2024-09-30 NOTE — Telephone Encounter (Signed)
 Requested medications are due for refill today.  yes  Requested medications are on the active medications list.  yes  Last refill. 08/12/2024 #30 0 rf  Future visit scheduled.   yes  Notes to clinic.  Refill not delegated.    Requested Prescriptions  Pending Prescriptions Disp Refills   QSYMIA  3.75-23 MG CP24 [Pharmacy Med Name: QSYMIA  3.75 MG-23 MG CAPSULE] 30 capsule 0    Sig: TAKE 1 CAPSULE BY MOUTH EVERY DAY     Not Delegated - Neurology: Anticonvulsants - Controlled - phentermine  / topiramate  Failed - 09/30/2024 11:22 AM      Failed - This refill cannot be delegated      Failed - Glucose (serum) in normal range and within 360 days    Glucose, Bld  Date Value Ref Range Status  02/22/2024 64 (L) 65 - 139 mg/dL Final    Comment:    .        Non-fasting reference interval .          Passed - Cr in normal range and within 360 days    Creat  Date Value Ref Range Status  02/22/2024 0.78 0.50 - 0.99 mg/dL Final         Passed - CO2 in normal range and within 360 days    CO2  Date Value Ref Range Status  02/22/2024 26 20 - 32 mmol/L Final         Passed - ALT in normal range and within 360 days    ALT  Date Value Ref Range Status  02/22/2024 14 6 - 29 U/L Final         Passed - AST in normal range and within 360 days    AST  Date Value Ref Range Status  02/22/2024 16 10 - 30 U/L Final         Passed - K in normal range and within 360 days    Potassium  Date Value Ref Range Status  02/22/2024 3.9 3.5 - 5.3 mmol/L Final         Passed - Completed PHQ-2 or PHQ-9 in the last 360 days      Passed - Patient is not pregnant      Passed - Last BP in normal range    BP Readings from Last 1 Encounters:  08/25/24 118/74         Passed - Last Heart Rate in normal range    Pulse Readings from Last 1 Encounters:  03/20/24 75         Passed - Valid encounter within last 6 months    Recent Outpatient Visits           1 month ago Hydradenitis   [place]  [facility] [place], Angeline ORN, NP   5 months ago Hydradenitis   [place] [facility] [place], Angeline ORN, NP   7 months ago Encounter for general adult medical examination with abnormal findings   [place] [facility] [place], Angeline ORN, NP   7 months ago Mass of upper outer quadrant of right breast   [place] [facility] [place], Angeline ORN, NP

## 2024-10-06 ENCOUNTER — Other Ambulatory Visit: Payer: Self-pay | Admitting: Internal Medicine

## 2024-10-07 ENCOUNTER — Other Ambulatory Visit: Payer: Self-pay | Admitting: Internal Medicine

## 2024-10-07 MED ORDER — PHENTERMINE-TOPIRAMATE ER 3.75-23 MG PO CP24: 1.0000 | ORAL_CAPSULE | Freq: Every day | ORAL | 0 refills | Status: DC

## 2024-10-13 ENCOUNTER — Other Ambulatory Visit: Payer: Self-pay | Admitting: Internal Medicine

## 2024-10-13 MED ORDER — PHENTERMINE-TOPIRAMATE ER 3.75-23 MG PO CP24: 1.0000 | ORAL_CAPSULE | Freq: Every day | ORAL | 0 refills | Status: DC

## 2024-10-23 ENCOUNTER — Other Ambulatory Visit: Payer: Self-pay | Admitting: Internal Medicine

## 2024-10-24 NOTE — Telephone Encounter (Signed)
 Requested medications are due for refill today.  unsure  Requested medications are on the active medications list.  yes  Last refill. 08/25/2024 #28 0 rf  Future visit scheduled.   yes  Notes to clinic.  Refill not delegated    Requested Prescriptions  Pending Prescriptions Disp Refills   predniSONE  (DELTASONE ) 10 MG tablet [Pharmacy Med Name: PREDNISONE  10 MG TABLET] 28 tablet 0    Sig: Take 7 tabs on day 1, 6 tabs on day 2, 5 tabs on day 3, 4 tabs on day 4, 3 tabs on day 5, 2 tab on day 6, 1 tab on day 7     Not Delegated - Endocrinology:  Oral Corticosteroids Failed - 10/24/2024  5:12 PM      Failed - This refill cannot be delegated      Failed - Manual Review: Eye exam for IOP if prolonged treatment      Failed - Glucose (serum) in normal range and within 180 days    Glucose, Bld  Date Value Ref Range Status  02/22/2024 64 (L) 65 - 139 mg/dL Final    Comment:    .        Non-fasting reference interval .          Failed - K in normal range and within 180 days    Potassium  Date Value Ref Range Status  02/22/2024 3.9 3.5 - 5.3 mmol/L Final         Failed - Na in normal range and within 180 days    Sodium  Date Value Ref Range Status  02/22/2024 139 135 - 146 mmol/L Final         Failed - Bone Mineral Density or Dexa Scan completed in the last 2 years      Passed - Last BP in normal range    BP Readings from Last 1 Encounters:  08/25/24 118/74         Passed - Valid encounter within last 6 months    Recent Outpatient Visits           2 months ago Hydradenitis   [place] [facility] [place], Angeline ORN, NP   6 months ago Hydradenitis   [place] [facility] [place], Angeline ORN, NP   8 months ago Encounter for general adult medical examination with abnormal findings   [place] [facility] [place], Angeline ORN, NP   8 months ago Mass of upper outer quadrant of right breast   [facility]  [place], Angeline ORN, [place]

## 2024-10-30 ENCOUNTER — Other Ambulatory Visit: Payer: Self-pay | Admitting: Internal Medicine

## 2024-10-31 NOTE — Telephone Encounter (Signed)
 Requested Prescriptions  Pending Prescriptions Disp Refills   AUROVELA 1.5/30 1.5-30 MG-MCG tablet [Pharmacy Med Name: AUROVELA 21 1.5-30 TABLET] 84 tablet 1    Sig: TAKE 1 TABLET BY MOUTH EVERY DAY     OB/GYN:  Contraceptives Passed - 10/31/2024  4:33 PM      Passed - Last BP in normal range    BP Readings from Last 1 Encounters:  08/25/24 118/74         Passed - Valid encounter within last 12 months    Recent Outpatient Visits           2 months ago Hydradenitis   [place] [facility] [place], Angeline ORN, NP   6 months ago Hydradenitis   [place] [facility] [place], Angeline ORN, NP   8 months ago Encounter for general adult medical examination with abnormal findings   [facility] [facility] [place], Angeline ORN, NP   8 months ago Mass of upper outer quadrant of right breast   [place] [facility] [place], Angeline ORN, [place]              Passed - Patient is not a smoker

## 2024-11-16 ENCOUNTER — Other Ambulatory Visit: Payer: Self-pay | Admitting: Internal Medicine

## 2024-11-18 NOTE — Telephone Encounter (Signed)
 Requested medication (s) are due for refill today: yes  Requested medication (s) are on the active medication list: yes  Last refill:  10/13/24  Future visit scheduled: {Yes  Notes to clinic:  Unable to refill per protocol, cannot delegate.      Requested Prescriptions  Pending Prescriptions Disp Refills   QSYMIA  3.75-23 MG CP24 [Pharmacy Med Name: QSYMIA  3.75 MG-23 MG CAPSULE] 30 capsule 0    Sig: TAKE 1 CAPSULE BY MOUTH EVERY DAY     Not Delegated - Neurology: Anticonvulsants - Controlled - phentermine  / topiramate  Failed - 11/18/2024  2:50 PM      Failed - This refill cannot be delegated      Failed - Glucose (serum) in normal range and within 360 days    Glucose, Bld  Date Value Ref Range Status  02/22/2024 64 (L) 65 - 139 mg/dL Final    Comment:    .        Non-fasting reference interval .          Passed - Cr in normal range and within 360 days    Creat  Date Value Ref Range Status  02/22/2024 0.78 0.50 - 0.99 mg/dL Final         Passed - CO2 in normal range and within 360 days    CO2  Date Value Ref Range Status  02/22/2024 26 20 - 32 mmol/L Final         Passed - ALT in normal range and within 360 days    ALT  Date Value Ref Range Status  02/22/2024 14 6 - 29 U/L Final         Passed - AST in normal range and within 360 days    AST  Date Value Ref Range Status  02/22/2024 16 10 - 30 U/L Final         Passed - K in normal range and within 360 days    Potassium  Date Value Ref Range Status  02/22/2024 3.9 3.5 - 5.3 mmol/L Final         Passed - Completed PHQ-2 or PHQ-9 in the last 360 days      Passed - Patient is not pregnant      Passed - Last BP in normal range    BP Readings from Last 1 Encounters:  08/25/24 118/74         Passed - Last Heart Rate in normal range    Pulse Readings from Last 1 Encounters:  03/20/24 75         Passed - Valid encounter within last 6 months    Recent Outpatient Visits           2 months ago  Hydradenitis   [facility] [facility] [place], Angeline ORN, NP   7 months ago Hydradenitis   [place] [facility] [place], Angeline ORN, NP   9 months ago Encounter for general adult medical examination with abnormal findings   [place] [facility] [place], Angeline ORN, NP   9 months ago Mass of upper outer quadrant of right breast   [place] [facility], Angeline ORN, [place]

## 2025-02-23 ENCOUNTER — Encounter: Admitting: Internal Medicine
